# Patient Record
Sex: Female | Born: 1955 | Race: White | Hispanic: No | Marital: Married | Smoking: Never smoker
Health system: Southern US, Community
[De-identification: ages and names within clinical notes are randomized; demographics above are authoritative.]

## PROBLEM LIST (undated history)

## (undated) DIAGNOSIS — R251 Tremor, unspecified: Secondary | ICD-10-CM

## (undated) DIAGNOSIS — F329 Major depressive disorder, single episode, unspecified: Secondary | ICD-10-CM

## (undated) DIAGNOSIS — N189 Chronic kidney disease, unspecified: Secondary | ICD-10-CM

## (undated) DIAGNOSIS — F32A Depression, unspecified: Secondary | ICD-10-CM

## (undated) HISTORY — DX: Major depressive disorder, single episode, unspecified: F32.9

## (undated) HISTORY — PX: BARTHOLIN CYST MARSUPIALIZATION: SHX5383

## (undated) HISTORY — DX: Depression, unspecified: F32.A

## (undated) HISTORY — PX: ABDOMINAL HYSTERECTOMY: SHX81

---

## 1999-10-15 ENCOUNTER — Encounter (INDEPENDENT_AMBULATORY_CARE_PROVIDER_SITE_OTHER): Payer: Self-pay

## 1999-10-15 ENCOUNTER — Other Ambulatory Visit: Admission: RE | Admit: 1999-10-15 | Discharge: 1999-10-15 | Payer: Self-pay | Admitting: *Deleted

## 2000-04-20 ENCOUNTER — Other Ambulatory Visit: Admission: RE | Admit: 2000-04-20 | Discharge: 2000-04-20 | Payer: Self-pay | Admitting: *Deleted

## 2000-05-01 ENCOUNTER — Encounter (INDEPENDENT_AMBULATORY_CARE_PROVIDER_SITE_OTHER): Payer: Self-pay

## 2000-05-01 ENCOUNTER — Ambulatory Visit (HOSPITAL_COMMUNITY): Admission: RE | Admit: 2000-05-01 | Discharge: 2000-05-01 | Payer: Self-pay | Admitting: *Deleted

## 2001-04-10 ENCOUNTER — Other Ambulatory Visit: Admission: RE | Admit: 2001-04-10 | Discharge: 2001-04-10 | Payer: Self-pay | Admitting: *Deleted

## 2002-10-04 ENCOUNTER — Other Ambulatory Visit: Admission: RE | Admit: 2002-10-04 | Discharge: 2002-10-04 | Payer: Self-pay | Admitting: *Deleted

## 2003-09-10 ENCOUNTER — Encounter: Admission: RE | Admit: 2003-09-10 | Discharge: 2003-09-10 | Payer: Self-pay | Admitting: General Surgery

## 2003-10-21 ENCOUNTER — Ambulatory Visit (HOSPITAL_COMMUNITY): Admission: RE | Admit: 2003-10-21 | Discharge: 2003-10-21 | Payer: Self-pay | Admitting: Orthopaedic Surgery

## 2004-03-10 ENCOUNTER — Encounter: Admission: RE | Admit: 2004-03-10 | Discharge: 2004-03-10 | Payer: Self-pay | Admitting: *Deleted

## 2004-06-18 ENCOUNTER — Encounter (INDEPENDENT_AMBULATORY_CARE_PROVIDER_SITE_OTHER): Payer: Self-pay | Admitting: Specialist

## 2004-06-18 ENCOUNTER — Ambulatory Visit (HOSPITAL_COMMUNITY): Admission: RE | Admit: 2004-06-18 | Discharge: 2004-06-18 | Payer: Self-pay | Admitting: *Deleted

## 2004-06-18 ENCOUNTER — Ambulatory Visit (HOSPITAL_BASED_OUTPATIENT_CLINIC_OR_DEPARTMENT_OTHER): Admission: RE | Admit: 2004-06-18 | Discharge: 2004-06-18 | Payer: Self-pay | Admitting: *Deleted

## 2004-09-30 ENCOUNTER — Encounter: Admission: RE | Admit: 2004-09-30 | Discharge: 2004-09-30 | Payer: Self-pay | Admitting: *Deleted

## 2005-12-13 ENCOUNTER — Encounter: Admission: RE | Admit: 2005-12-13 | Discharge: 2005-12-13 | Payer: Self-pay | Admitting: *Deleted

## 2006-12-19 ENCOUNTER — Encounter: Admission: RE | Admit: 2006-12-19 | Discharge: 2006-12-19 | Payer: Self-pay | Admitting: Obstetrics and Gynecology

## 2007-12-24 ENCOUNTER — Encounter: Admission: RE | Admit: 2007-12-24 | Discharge: 2007-12-24 | Payer: Self-pay | Admitting: Obstetrics and Gynecology

## 2008-12-29 ENCOUNTER — Encounter: Admission: RE | Admit: 2008-12-29 | Discharge: 2008-12-29 | Payer: Self-pay | Admitting: Obstetrics and Gynecology

## 2009-01-07 ENCOUNTER — Encounter: Admission: RE | Admit: 2009-01-07 | Discharge: 2009-01-07 | Payer: Self-pay | Admitting: Obstetrics and Gynecology

## 2010-02-04 ENCOUNTER — Encounter
Admission: RE | Admit: 2010-02-04 | Discharge: 2010-02-04 | Payer: Self-pay | Source: Home / Self Care | Attending: Obstetrics & Gynecology | Admitting: Obstetrics & Gynecology

## 2010-05-28 NOTE — Op Note (Signed)
[facility]  Patient:    Raven Golden, Raven Golden                          MRN: [id number] Proc. Date: 05/01/00 Attending:  Pershing Cox, M.D.                           Operative Report  PREOPERATIVE DIAGNOSIS:  Enlarging Bartholins gland and dyspareunia.  POSTOPERATIVE DIAGNOSIS:  Enlarging Bartholins gland and dyspareunia.  PROCEDURE:  Excision of right Bartholins gland.  ANESTHESIA:  General by LMA.  SURGEON:  Pershing Cox, M.D.  INDICATIONS FOR PROCEDURE:  This patient is a 55 year old widow.  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  It has never been infected.  It has never required drainage.  In November of last year, she presented with right vulvar swelling.  She was seen by Dr. Charmayne Sheer who recommended sitz baths, and then saw her in follow up, at which time, the gland was stable.  She was seen for annual examination in March of this year.  The mass was becoming bothersome.  It was bigger in size than it had been before, increasing 1 cm to 1.5 cm since January.  The patient requested excision, and is brought to the operating room today for this procedure.  OPERATIVE FINDINGS:  A 3 cm cystic mass lying in the area of the Bartholins gland.  No other abnormal findings.  DESCRIPTION OF PROCEDURE:  The patient was brought to the operating room with an IV in place.  This had been placed in the holding area where she received 1 g of Ancef.  PAS stockings were placed on her lower extremities.  She was taken to the operating room and placed supine on the OR table.  General anesthesia was administered by LMA without difficulty.  She was then placed in the [place] stirrups, and the lower abdomen, perineum, and vagina were prepped with solution of Hibiclens.  A sterile catheter was used to empty the bladder. The noted Bartholins gland was elevated through the perineum so that the site of closest  approximation to the inner vagina could be seen.  Quarter percent Marcaine with epinephrine was injected into the skin surface of the vagina for a length of approximately 2 cm.  A scalpel was used to incise a 2 cm incision. Small plastic size Allis clamps were placed along the margins of the incision and the incisions were lifted.  Using sharp dissection, the gland was separated from the underlying vaginal mucosa, especially on the patients right side.  Careful dissection of the mass was continued down the side of the cyst until we reached the base.  Once the base had been reached, a finger was placed into the vagina to be certain that this was not attached to the rectum. It was not.  The Bartholins gland was dissected free from the underlying tissues, and removed and sent for permanent section.  With the finger in the rectum, the tissues over the rectum were closed in two layers using 3-0 Vicryl sutures.  Several individual bleeders were contained by cautery or by small suture ligation using the 3-0 Vicryl.  One area of attachment of the Bartholins gland continued to bleed.  This was clamped with a right angle and free tie ligated.  The dead space where the Bartholins gland had been was closed with  interrupted sutures of 0 Vicryl. Cautery was used when appropriate to gain hemostasis from small bleeders.  The vaginal mucosa was reapproximated using a running with alternate locking stitch of 3-0 chromic.  Packing was placed into the vagina.  She was taken to the recovery room where she was observed.  The vaginal packing will be removed before she leaves the recovery room. DD:  05/01/00 TD:  05/02/00 Job: [id number]

## 2010-05-28 NOTE — Op Note (Signed)
Raven Golden                 ACCOUNT NO.:  [id number]   MEDICAL RECORD NO.:  [id number]          PATIENT TYPE:  AMB   LOCATION:  [facility]                         FACILITY:  [facility]   PHYSICIAN:  Pershing Cox, M.D.DATE OF BIRTH:  1955/11/15   DATE OF PROCEDURE:  06/18/2004  DATE OF DISCHARGE:                                 OPERATIVE REPORT   PREOPERATIVE DIAGNOSIS:  Heavy gelatinous endometrial discharge and  endometrial filling defect.   POSTOPERATIVE DIAGNOSIS:  Lush polypoid endometrium and 10 cm endometrial  cavity.   PROCEDURE:  Dilatation and curettage with hysteroscopy and resection of  endometrium.   SURGEON:  Dr. Carey Bullocks.   ANESTHESIA:  MAC plus Marcaine paracervical block.   INDICATIONS FOR PROCEDURE:  Raven Golden is 55 years old.  On several  occasions in my office she was found to have a very thick, gelatinous  discharge which appeared to be coming from the endometrial cavity.  Sonogram  was performed to look to see if there were higher endocervical polyps, as  she had had a lower cervical polyp.  On two occasions, her endometrium was  very thick, and hydrosonogram was subsequently performed which showed the  suggestion of an endometrial polyp.  For that reason, she is brought to the  operating room today for [facility] hysteroscopy.   OPERATIVE FINDINGS:  The patient's uterus is anteflexed.  There were no  palpable adnexal masses.  Cervix is normal in appearance and the endometrial  cavity sounded to 10 cm.  The lower uterine segment was difficult to dilate,  as there appeared to be a filling defect there.  The endometrium was very  lush and very polypoid, despite the patient's Provera withdrawal in  preparation for this procedure.   PROCEDURE:  Raven Golden is brought to the operating room with an IV in  place.  She had received 1 g of Ancef in the holding area.  Supine on the OR  table, IV sedation was administered, and she was placed into Raven Golden stirrups  and  comfortably positioned for the operative procedure.  Betadine was used  to prep the lower abdomen, pelvis, and vagina.  The urethra was also prepped  with Betadine, and the bladder was sterilely emptied with a red rubber  catheter.  The patient was then draped for a sterile vaginal procedure with  a collecting drape beneath her hips to measure the effluent during her  hysteroscopic procedure.   Bivalve speculum was inserted into the vagina.  The cervix was anesthetized  on the anterior surface with Marcaine, and a single-tooth tenaculum was used  to grasp the structure.  Then a Marcaine paracervical block was administered  by injecting 0.25% Marcaine at the 3, 4, 7, and 8 positions using a total  volume of 20 mL of this solution.  Kevorkian curette was used to obtain  endocervical curettings onto a Telfa, and these were lush.  The sound then  passed to a depth of 10 cm.  Serial Pratt dilators were used to dilate the  cervix.  There was some difficulty in passing the 31 dilator.  The  hysteroscope was introduced, and there were fronds of tissue in the upper  endocervix, and these were separately collected by resection and labeled  high endocervical tissue.  Next, the dilatation was continued, and I was  able to pass up to a 33 Pratt dilator.  The resectoscope was introduced into  the cavity, and the cavity was visualized.  There was lush endometrium  covering all of the surfaces, and the ostia could not be visualized.  The  hysteroscope was removed.  A small sharp #2 curette was used to curette the  endometrial wall onto a Telfa, and the majority of the endometrium was  removed.  The hysteroscope was then reintroduced, and we could see what  appeared to be a normal endometrial cavity with no evidence of filling  defect.  The resectoscope was used to resect areas of residual endometrium  and an area on the posterior lower uterine segment which was bulbous.  After  this procedure, the cavity  was visualized, and there was no evidence of  active bleeding.  A Meigs curette was used to curette the endometrial walls.  The sound was then again passed to a depth of 8 cm, and there was no  evidence of perforation.  The tenaculum was removed.  When bleeding  persisted from these sites, silver nitrate was used to halt this bleeding.   The patient was taken to the recovery room in good condition.       MAJ/MEDQ  D:  06/18/2004  T:  06/18/2004  Job:  [id number]

## 2010-11-25 ENCOUNTER — Encounter (INDEPENDENT_AMBULATORY_CARE_PROVIDER_SITE_OTHER): Payer: Self-pay | Admitting: *Deleted

## 2010-11-25 ENCOUNTER — Telehealth (INDEPENDENT_AMBULATORY_CARE_PROVIDER_SITE_OTHER): Payer: Self-pay | Admitting: Internal Medicine

## 2010-11-25 NOTE — Telephone Encounter (Signed)
Would like to know when her next tcs is due. Please return the call to [phone].

## 2010-11-25 NOTE — Telephone Encounter (Addendum)
rec'd records from [facility], put on NUR cart to see when next TCS due, patient is aware

## 2010-11-30 NOTE — Telephone Encounter (Signed)
Per Dr Karilyn Cota, next TCS dues 8/13 but if problems develop call office, patient is aware, TCS noted for 8/13 in computer

## 2011-02-02 ENCOUNTER — Other Ambulatory Visit: Payer: Self-pay | Admitting: Obstetrics & Gynecology

## 2011-02-02 DIAGNOSIS — Z1231 Encounter for screening mammogram for malignant neoplasm of breast: Secondary | ICD-10-CM

## 2011-02-08 ENCOUNTER — Ambulatory Visit
Admission: RE | Admit: 2011-02-08 | Discharge: 2011-02-08 | Disposition: A | Discharge status: Home / Self Care | Payer: BC Managed Care – PPO | Source: Ambulatory Visit | Attending: Obstetrics & Gynecology | Admitting: Obstetrics & Gynecology

## 2011-02-08 DIAGNOSIS — Z1231 Encounter for screening mammogram for malignant neoplasm of breast: Secondary | ICD-10-CM

## 2011-05-13 ENCOUNTER — Encounter (INDEPENDENT_AMBULATORY_CARE_PROVIDER_SITE_OTHER): Payer: Self-pay

## 2011-08-18 ENCOUNTER — Encounter (INDEPENDENT_AMBULATORY_CARE_PROVIDER_SITE_OTHER): Payer: Self-pay | Admitting: *Deleted

## 2011-10-10 ENCOUNTER — Other Ambulatory Visit (INDEPENDENT_AMBULATORY_CARE_PROVIDER_SITE_OTHER): Payer: Self-pay | Admitting: *Deleted

## 2011-10-10 ENCOUNTER — Telehealth (INDEPENDENT_AMBULATORY_CARE_PROVIDER_SITE_OTHER): Payer: Self-pay | Admitting: *Deleted

## 2011-10-10 DIAGNOSIS — Z86010 Personal history of colonic polyps: Secondary | ICD-10-CM

## 2011-10-10 DIAGNOSIS — Z1211 Encounter for screening for malignant neoplasm of colon: Secondary | ICD-10-CM

## 2011-10-10 MED ORDER — PEG-KCL-NACL-NASULF-NA ASC-C 100 G PO SOLR: 1.0000 | Freq: Once | ORAL | Status: DC

## 2011-10-10 NOTE — Telephone Encounter (Signed)
Patient needs movi prep 

## 2011-11-01 ENCOUNTER — Telehealth (INDEPENDENT_AMBULATORY_CARE_PROVIDER_SITE_OTHER): Payer: Self-pay | Admitting: *Deleted

## 2011-11-01 NOTE — Telephone Encounter (Signed)
PCP/Requesting MD:   Name & DOB: Makylah Aschenbrenner [birth year]   Procedure: tcs  Reason/Indication:  Hx polyps  Has patient had this procedure before?  yes  If so, when, by whom and where?  2008  Is there a family history of colon cancer?  no  Who?  What age when diagnosed?    Is patient diabetic?   no      Does patient have prosthetic heart valve?  no  Do you have a pacemaker?  no  Has patient had joint replacement within last 12 months?  no  Is patient on Coumadin, Plavix and/or Aspirin? yes  Medications: asa 81 mg daily, lexapro 10 mg daily, primidone 50 mg 2 tab at bedtime, propranolol 60 mg daily, vit b12, vit 2  Allergies: nkda  Medication Adjustment: asa 2 days  Procedure date & time: 12/01/11 ay 1030

## 2011-11-03 NOTE — Telephone Encounter (Signed)
agree

## 2011-11-23 ENCOUNTER — Encounter (HOSPITAL_COMMUNITY): Payer: Self-pay | Admitting: Pharmacy Technician

## 2011-12-01 ENCOUNTER — Encounter (HOSPITAL_COMMUNITY)
Admission: RE | Disposition: A | Discharge status: Home / Self Care | Payer: Self-pay | Source: Ambulatory Visit | Attending: Internal Medicine

## 2011-12-01 ENCOUNTER — Encounter (HOSPITAL_COMMUNITY): Payer: Self-pay | Admitting: *Deleted

## 2011-12-01 ENCOUNTER — Ambulatory Visit (HOSPITAL_COMMUNITY)
Admission: RE | Admit: 2011-12-01 | Discharge: 2011-12-01 | Disposition: A | Discharge status: Home / Self Care | Payer: 59 | Source: Ambulatory Visit | Attending: Internal Medicine | Admitting: Internal Medicine

## 2011-12-01 DIAGNOSIS — K644 Residual hemorrhoidal skin tags: Secondary | ICD-10-CM

## 2011-12-01 DIAGNOSIS — Z860100 Personal history of colon polyps, unspecified: Secondary | ICD-10-CM | POA: Insufficient documentation

## 2011-12-01 DIAGNOSIS — K5730 Diverticulosis of large intestine without perforation or abscess without bleeding: Secondary | ICD-10-CM | POA: Insufficient documentation

## 2011-12-01 HISTORY — PX: COLONOSCOPY: SHX5424

## 2011-12-01 HISTORY — DX: Tremor, unspecified: R25.1

## 2011-12-01 SURGERY — COLONOSCOPY
Anesthesia: Moderate Sedation

## 2011-12-01 MED ORDER — MEPERIDINE HCL 50 MG/ML IJ SOLN
INTRAMUSCULAR | Status: DC | PRN
  Administered 2011-12-01: 25 mg via INTRAVENOUS

## 2011-12-01 MED ORDER — MEPERIDINE HCL 50 MG/ML IJ SOLN
INTRAMUSCULAR | Status: AC
  Filled 2011-12-01: qty 1

## 2011-12-01 MED ORDER — MIDAZOLAM HCL 5 MG/5ML IJ SOLN
INTRAMUSCULAR | Status: DC | PRN
  Administered 2011-12-01 (×2): 2 mg via INTRAVENOUS

## 2011-12-01 MED ORDER — MIDAZOLAM HCL 5 MG/5ML IJ SOLN
INTRAMUSCULAR | Status: AC
  Filled 2011-12-01: qty 10

## 2011-12-01 MED ORDER — HYOSCYAMINE SULFATE 0.125 MG SL SUBL: 0.1250 mg | SUBLINGUAL_TABLET | Freq: Three times a day (TID) | SUBLINGUAL | Status: DC | PRN

## 2011-12-01 MED ORDER — SODIUM CHLORIDE 0.45 % IV SOLN
INTRAVENOUS | Status: DC
  Administered 2011-12-01: 10:00:00 via INTRAVENOUS

## 2011-12-01 MED ORDER — STERILE WATER FOR IRRIGATION IR SOLN
Status: DC | PRN
  Administered 2011-12-01: 11:00:00

## 2011-12-01 NOTE — H&P (Signed)
Raven Golden is an 56 y.o. female.   Chief Complaint: Patient is here for colonoscopy. HPI: Patient is 56 year old Caucasian female who is here for surveillance colonoscopy. He has history of colonic adenomas. She had adenomas found in 2 prior colonoscopies. Last exam was in 2000 and she denies abdominal pain rectal bleeding. Family history is negative for colorectal carcinoma.  Past Medical History  Diagnosis Date  . Tremors of nervous system     Past Surgical History  Procedure Date  . Abdominal hysterectomy   . Cesarean section 1983, 1985    X 2    Family History  Problem Relation Age of Onset  . Colon cancer Neg Hx    Social History:  reports that she has never smoked. She does not have any smokeless tobacco history on file. She reports that she does not drink alcohol or use illicit drugs.  Allergies: No Known Allergies  Medications Prior to Admission  Medication Sig Dispense Refill  . estradiol (VIVELLE-DOT) 0.1 MG/24HR Place 1 patch onto the skin 2 (two) times a week.      . peg 3350 powder (MOVIPREP) 100 G SOLR Take 1 kit (100 g total) by mouth once.  1 kit  0  . Cholecalciferol (VITAMIN D PO) Take 1 capsule by mouth daily.      . Cyanocobalamin (VITAMIN B 12 PO) Take 1 tablet by mouth daily.      Marland Kitchen escitalopram (LEXAPRO) 10 MG tablet Take 10 mg by mouth daily.      . propranolol ER (INDERAL LA) 60 MG 24 hr capsule Take 60 mg by mouth daily.      Marland Kitchen trimethoprim (TRIMPEX) 100 MG tablet Take 100 mg by mouth daily.        No results found for this or any previous visit (from the past 48 hour(s)). No results found.  ROS  Blood pressure 104/43, pulse 48, temperature 97.7 F (36.5 C), temperature source Oral, resp. rate 10, height 5\' 3"  (1.6 m), weight 125 lb (56.7 kg), SpO2 99.00%. Physical Exam  Constitutional: She appears well-developed and well-nourished.  HENT:  Mouth/Throat: Oropharynx is clear and moist.  Eyes: Conjunctivae normal are normal. No scleral icterus.   Neck: No thyromegaly present.  Cardiovascular: Normal rate, regular rhythm and normal heart sounds.   No murmur heard. Respiratory: Effort normal and breath sounds normal.  GI: Soft. She exhibits no distension and no mass. There is no tenderness.  Musculoskeletal: She exhibits no edema.  Lymphadenopathy:    She has no cervical adenopathy.  Neurological: She is alert.  Skin: Skin is warm and dry.     Assessment/Plan History of colonic adenomas. Surveillance colonoscopy.  Raven Golden U 12/01/2011, 10:42 AM

## 2011-12-01 NOTE — Op Note (Signed)
COLONOSCOPY PROCEDURE REPORT  PATIENT:  Raven Golden  MR#:  [id number] Birthdate:  1955-12-23, 56 y.o., female Endoscopist:  Dr. Malissa Hippo, MD Referred By:  Dr. Catalina Pizza, MD Procedure Date: 12/01/2011  Procedure:   Colonoscopy  Indications:  Patient is 56 year old Caucasian female with history of colonic adenomas. She is undergoing surveillance colonoscopy.  Informed Consent:  The procedure and risks were reviewed with the patient and informed consent was obtained.  Medications:  Demerol 25 mg IV Versed 4 mg IV  Description of procedure:  After a digital rectal exam was performed, that colonoscope was advanced from the anus through the rectum and colon to the area of the cecum, ileocecal valve and appendiceal orifice. The cecum was deeply intubated. These structures were well-seen and photographed for the record. From the level of the cecum and ileocecal valve, the scope was slowly and cautiously withdrawn. The mucosal surfaces were carefully surveyed utilizing scope tip to flexion to facilitate fold flattening as needed. The scope was pulled down into the rectum where a thorough exam including retroflexion was performed. Terminal ileum was also examined.  Findings:   Prep excellent. Normal terminal ileum. Few small diverticula at sigmoid colon. No evidence of recurrent polyps. Normal rectal mucosa. Mall hemorrhoids below the dentate line.  Therapeutic/Diagnostic Maneuvers Performed:  None  Complications:  None  Cecal Withdrawal Time:  10 minutes  Impression:  Normal terminal ileum. few small diverticula at sigmoid colon and external hemorrhoids. No evidence of recurrent polyps.  Recommendations:  Standard instructions given. Sketching given for Levsin SL one 3 times a day when necessary 60 doses with 1 refill. She may wait 7 years before her next exam.  REHMAN,NAJEEB U  12/01/2011 11:11 AM  CC: Dr. Dwana Melena, MD & Dr. Bonnetta Barry ref. provider found

## 2011-12-05 ENCOUNTER — Encounter (HOSPITAL_COMMUNITY): Payer: Self-pay | Admitting: Internal Medicine

## 2014-04-10 ENCOUNTER — Other Ambulatory Visit: Payer: Self-pay | Admitting: Obstetrics & Gynecology

## 2014-04-10 DIAGNOSIS — N6310 Unspecified lump in the right breast, unspecified quadrant: Secondary | ICD-10-CM

## 2014-04-10 DIAGNOSIS — N6320 Unspecified lump in the left breast, unspecified quadrant: Secondary | ICD-10-CM

## 2014-04-14 ENCOUNTER — Other Ambulatory Visit: Payer: Self-pay

## 2014-04-16 ENCOUNTER — Ambulatory Visit
Admission: RE | Admit: 2014-04-16 | Discharge: 2014-04-16 | Disposition: A | Discharge status: Home / Self Care | Payer: 59 | Source: Ambulatory Visit | Attending: Obstetrics & Gynecology | Admitting: Obstetrics & Gynecology

## 2014-04-16 DIAGNOSIS — N6310 Unspecified lump in the right breast, unspecified quadrant: Secondary | ICD-10-CM

## 2014-04-16 DIAGNOSIS — N6320 Unspecified lump in the left breast, unspecified quadrant: Secondary | ICD-10-CM

## 2014-04-18 ENCOUNTER — Other Ambulatory Visit: Payer: Self-pay

## 2016-12-27 IMAGING — MG MM DIAG BREAST TOMO BILATERAL
6 of 10 series · 6 of 30 positions shown · non-contrast
Comparison: 05/08/2013, 02/08/2011, additional prior studies dating
back to 12/24/2007

CLINICAL DATA: 59-year-old female seen for evaluation of a right
breast mass at 10 o'clock and left breast mass at 4 o'clock. Per the
patient, her right breast feels lumpy in general. She reports focal
tenderness in the right breast at 9 o'clock. Within the left breast,
she reports an area of focal pain at 4 o'clock and a small lump at 5
o'clock.

EXAM:
DIGITAL DIAGNOSTIC BILATERAL MAMMOGRAM WITH 3D TOMOSYNTHESIS WITH
CAD
ULTRASOUND BILATERAL BREAST

[R MLO]
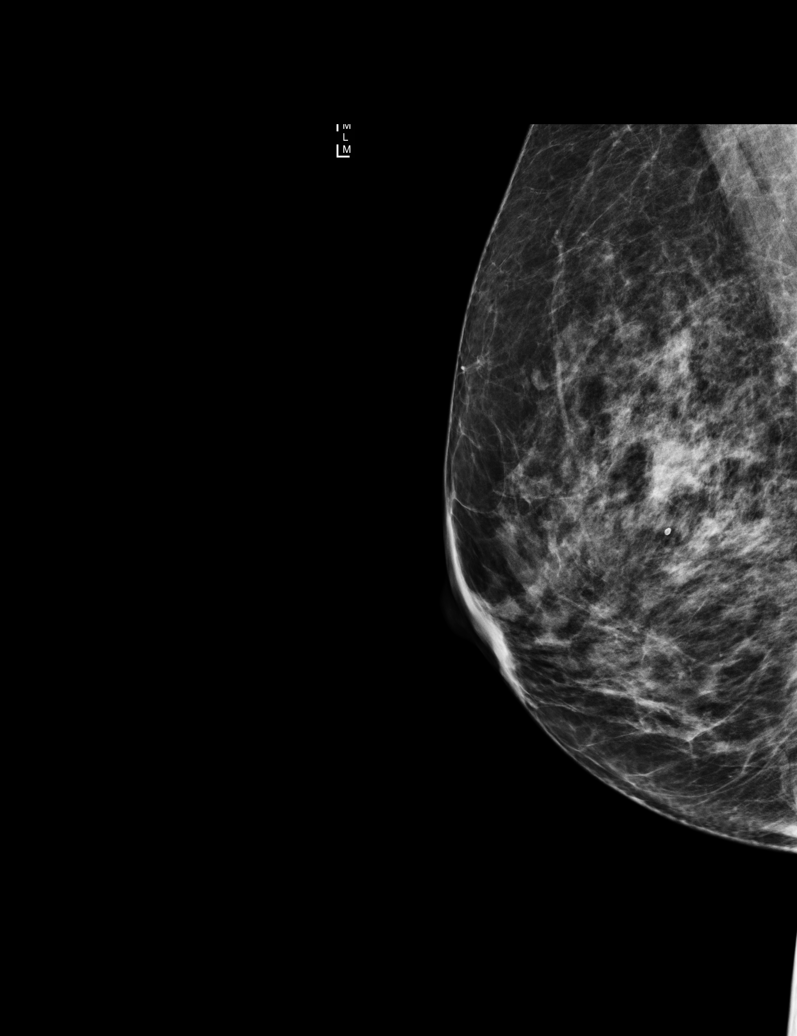

[R CC]
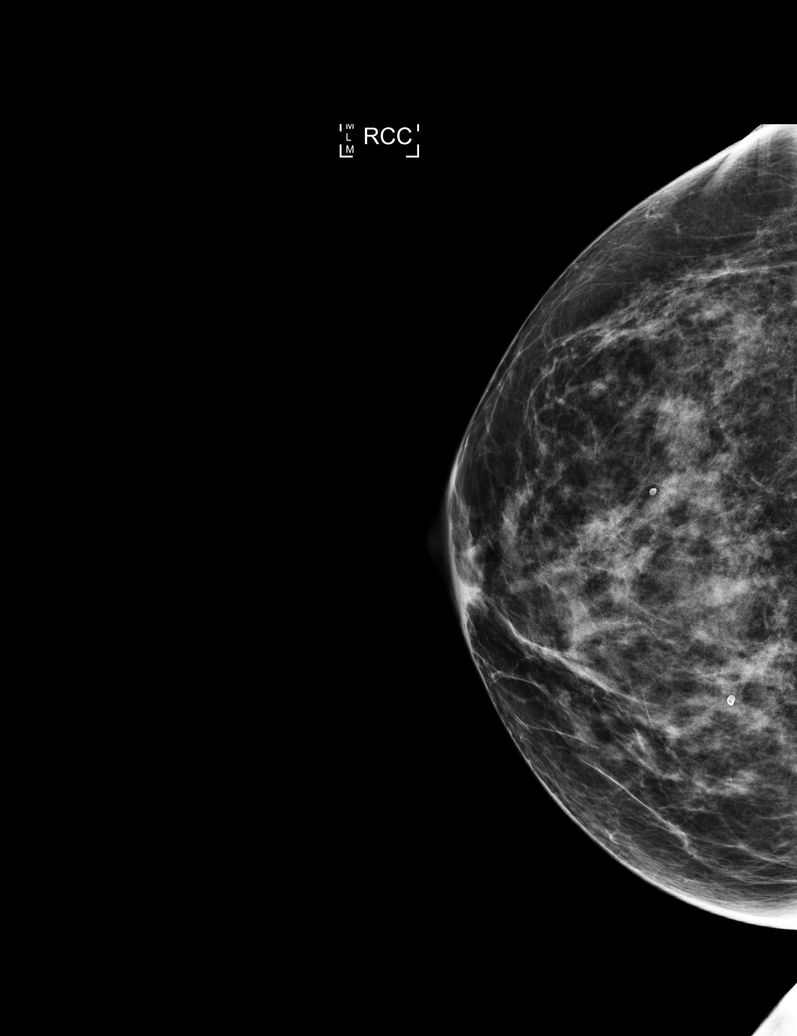

[L CC (1 of 2)]
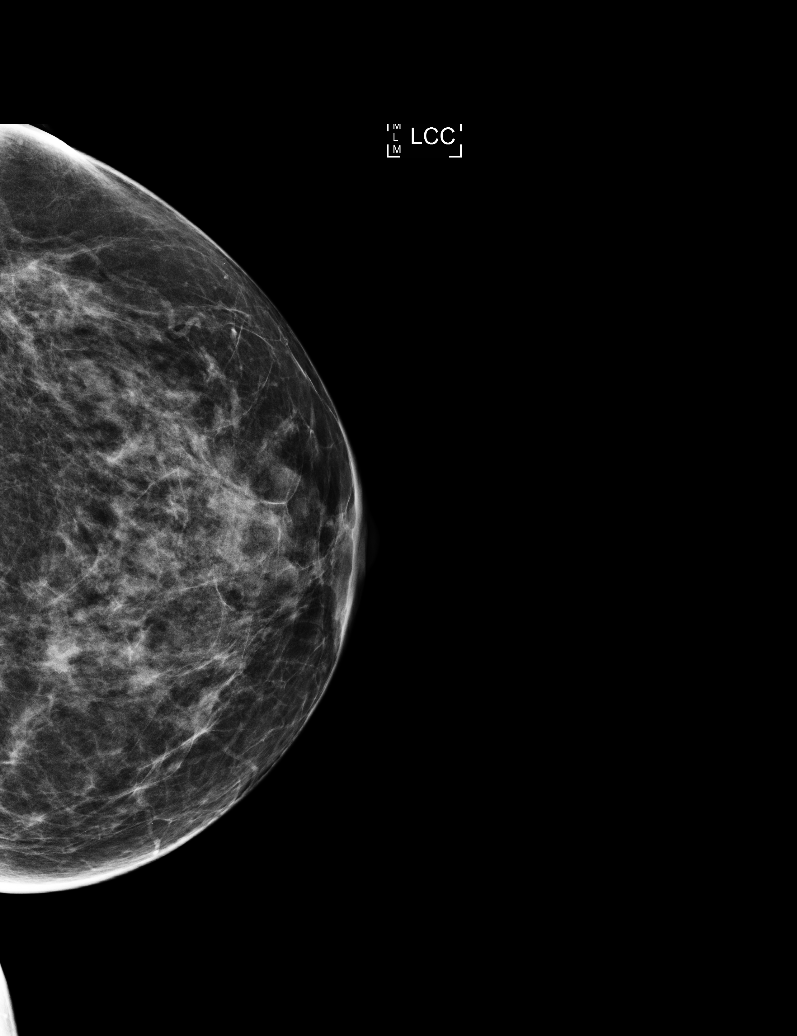

[L CC (2 of 2)]
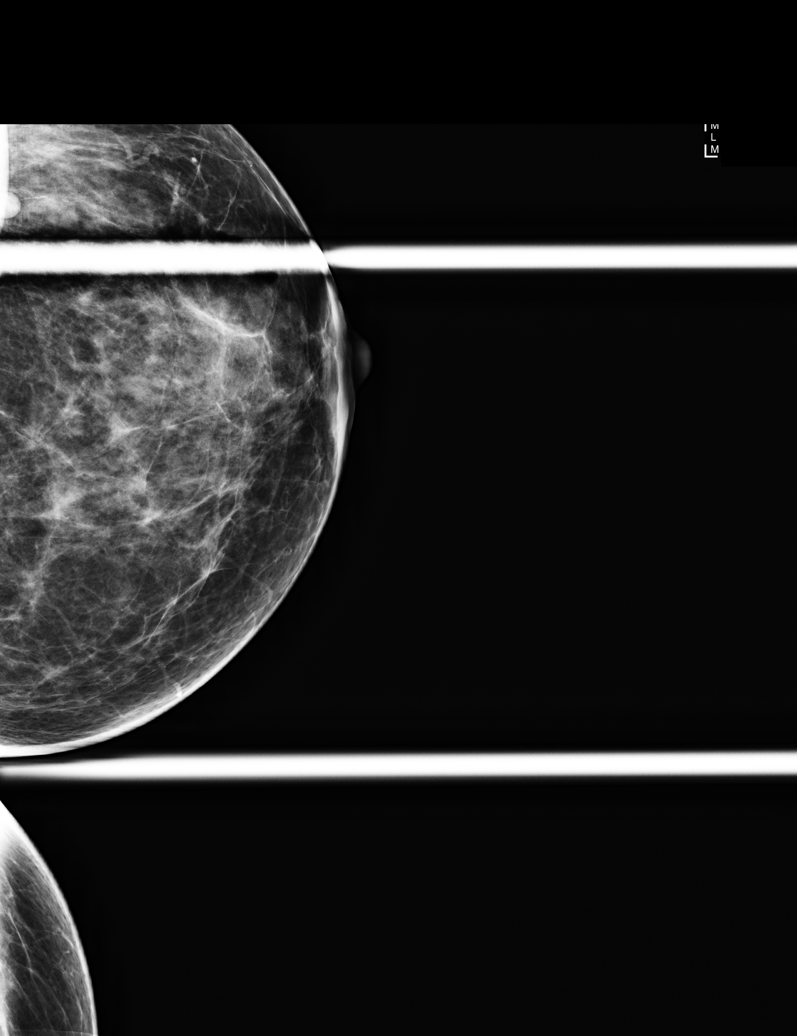

[L MLO]
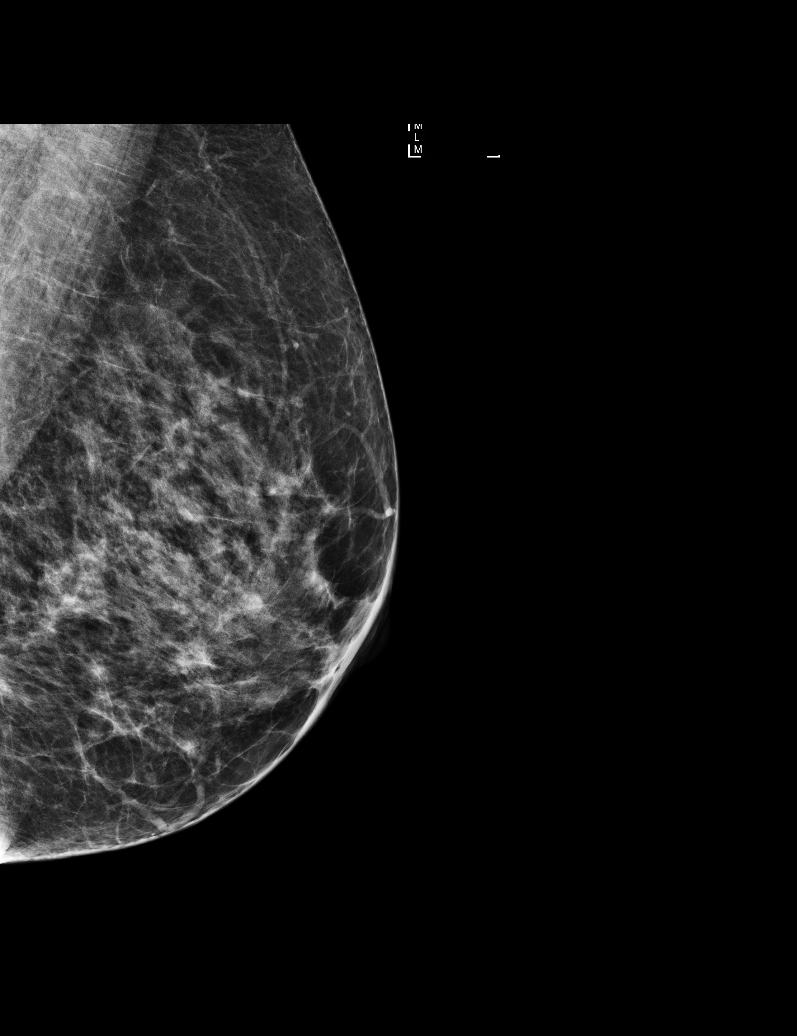

[L CC tomo · tomo slice 37/73.0]
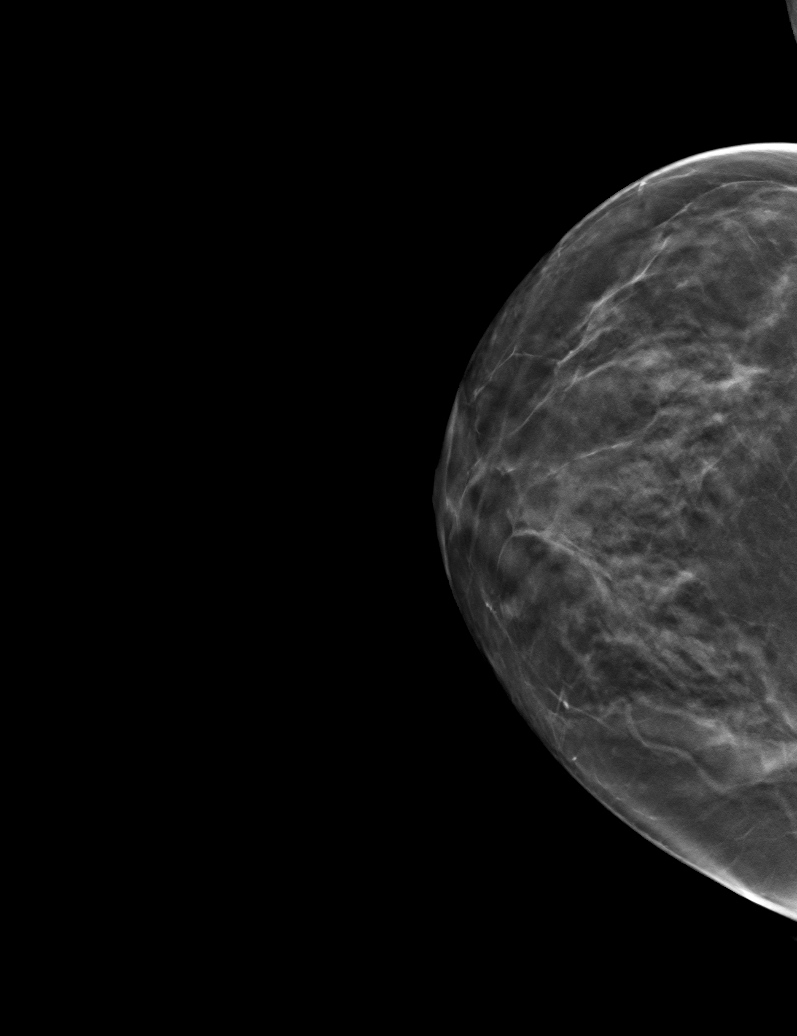

[6 of 30 positions shown; findings below may reference images not displayed]

ACR Breast Density Category c: The breast tissue is heterogeneously
dense, which may obscure small masses.
FINDINGS: Within the slightly inner right breast, posterior depth, there is an
oval 10 mm mass with predominantly circumscribed margins. An
additional oval, circumscribed mass is seen within the upper, outer
right breast which appears unchanged over multiple prior exams.

No suspicious mass, calcifications, or other abnormality is
identified within the left breast.

Mammographic images were processed with CAD.

On physical exam, no discrete mass is felt within the areas of
concern within the right breast at 9 and 10 o'clock or within the
areas of concern within the left breast at 4 and 5 o'clock.

Targeted ultrasound is performed, showing a simple cyst within the
right breast at 10 o'clock, 2 cm from the nipple measuring 8 x 6 x
10 mm, corresponding to the mass seen mammographically within the
upper, outer right breast. There is an additional minimally
complicated cyst at [DATE], 4 cm from the nipple measuring 8 x 9 x 5
mm, corresponding to the mass seen within the slightly inner right
breast. A small additional adjacent cyst is incidentally noted. No
suspicious cystic or solid sonographic finding is identified in the
areas of concern.

No suspicious cystic or solid sonographic finding is seen within the
area of tenderness within the left breast at 4 o'clock or within the
palpable area of concern within the left breast at 5 o'clock.
IMPRESSION: No mammographic or sonographic evidence of malignancy.

RECOMMENDATION:
Screening mammogram in one year.(Code:L7-Q-8DP)

I have discussed the findings and recommendations with the patient.
Results were also provided in writing at the conclusion of the
visit. If applicable, a reminder letter will be sent to the patient
regarding the next appointment.

BI-RADS CATEGORY  2: Benign.

## 2017-10-27 ENCOUNTER — Encounter: Payer: Self-pay | Admitting: Neurology

## 2017-11-06 ENCOUNTER — Encounter: Payer: Self-pay | Admitting: Neurology

## 2017-11-07 NOTE — Progress Notes (Signed)
Subjective:   LITICIA GASIOR was seen in consultation in the movement disorder clinic at the request of Vyas, Dhruv B, MD.  The evaluation is for tremor.  Tremor started in the 1990's and started in the bilateral UE's.  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  She used to take it prn but now takes it daily.  Tremor has gotten worse with time.  She started primidone 8 years ago at 100 mg daily and it helps.   Tremor is now most bothersome in the head and she will shake in the "no" direction. Stress makes this worse.  If she holds the right posterior occiput, head tremor seems to be better.  There is a family hx of tremor in her mother and maternal aunt  Affected by caffeine:  No. (doesn't drink much caffeine) Affected by alcohol:  Doesn't drink EtOH Affected by stress:  Yes.   Affected by fatigue:  No. Spills soup if on spoon:  No. Spills glass of liquid if full:  No. Affects ADL's (tying shoes, brushing teeth, etc):  No.  Current/Previously tried tremor medications: on primidone - 100 mg q hs; on propranolol - 60 mg daily  Current medications that may exacerbate tremor:  N/a  Outside reports reviewed: historical medical records, office notes and referral letter/letters.  No Known Allergies  Outpatient Encounter Medications as of 11/09/2017  Medication Sig  . Cholecalciferol (VITAMIN D PO) Take 1 capsule by mouth daily.  Marland Kitchen escitalopram (LEXAPRO) 10 MG tablet Take 10 mg by mouth daily.  Marland Kitchen estradiol (ESTRACE) 1 MG tablet Take 1 mg by mouth daily.  . Omega-3 Fatty Acids (FISH OIL) 1000 MG CAPS Take by mouth.  . primidone (MYSOLINE) 50 MG tablet Take 100 mg by mouth at bedtime.  . propranolol ER (INDERAL LA) 60 MG 24 hr capsule Take 60 mg by mouth daily.  . Red Yeast Rice Extract (RED YEAST RICE PO) Take by mouth daily.  . [DISCONTINUED] tretinoin (RETIN-A) 0.05 % cream Apply topically at bedtime.   No facility-administered encounter medications on file as of  11/09/2017.     Past Medical History:  Diagnosis Date  . Depression   . Tremors of nervous system     Past Surgical History:  Procedure Laterality Date  . ABDOMINAL HYSTERECTOMY    . BARTHOLIN CYST MARSUPIALIZATION    . CESAREAN SECTION  1983, 1985   X 2  . COLONOSCOPY  12/01/2011   Procedure: COLONOSCOPY;  Surgeon: Malissa Hippo, MD;  Location: [facility];  Service: Endoscopy;  Laterality: N/A;  1030    Social History   Socioeconomic History  . Marital status: Married    Spouse name: Not on file  . Number of children: Not on file  . Years of education: Not on file  . Highest education level: Not on file  Occupational History  . 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 Financial resource strain: Not on file  . Food insecurity:    Worry: Not on file    Inability: Not on file  . Transportation needs:    Medical: Not on file    Non-medical: Not on file  Tobacco Use  . Smoking status: Never Smoker  . Smokeless tobacco: Never Used  Substance and Sexual Activity  . Alcohol use: No  . Drug use: No  . Sexual activity: Not on file  Lifestyle  . Physical  activity:    Days per week: Not on file    Minutes per session: Not on file  . Stress: Not on file  Relationships  . Social connections:    Talks on phone: Not on file    Gets together: Not on file    Attends religious service: Not on file    Active member of club or organization: Not on file    Attends meetings of clubs or organizations: Not on file    Relationship status: Not on file  . Intimate partner violence:    Fear of current or ex partner: Not on file    Emotionally abused: Not on file    Physically abused: Not on file    Forced sexual activity: Not on file  Other Topics Concern  . Not on file  Social History Narrative  . Not on file    Family Status  Relation Name Status  . Mother  Deceased  . Father  Deceased  . Sister  Alive  . Brother  Alive  . Son  Civil engineer, contracting  . Neg Hx  (Not Specified)    Review of Systems Review of Systems  Constitutional: Positive for malaise/fatigue.  HENT: Negative.   Eyes: Negative.   Respiratory: Negative.   Cardiovascular: Negative.   Gastrointestinal: Negative.   Genitourinary: Negative.   Musculoskeletal: Negative.   Skin: Negative.   Neurological: Negative.   Endo/Heme/Allergies: Negative.   Psychiatric/Behavioral: Negative.      Objective:   VITALS:   Vitals:   11/09/17 0958  BP: (!) 94/56  Pulse: (!) 56  SpO2: 98%  Weight: 132 lb (59.9 kg)  Height: 5\' 3"  (1.6 m)   Gen:  Appears stated age and in NAD. HEENT:  Normocephalic, atraumatic. The mucous membranes are moist. The superficial temporal arteries are without ropiness or tenderness. Cardiovascular: brady.  regular Lungs: Clear to auscultation bilaterally. Neck: There are no carotid bruits noted bilaterally. MS:  There is slight hypertrophy of the R SCM and there is tremor when the head is turned L.    NEUROLOGICAL:  Orientation:  The patient is alert and oriented x 3.  Recent and remote memory are intact.  Attention span and concentration are normal.  Able to name objects and repeat without trouble.  Fund of knowledge is appropriate Cranial nerves: There is good facial symmetry. The pupils are equal round and reactive to light bilaterally. Fundoscopic exam reveals clear disc margins bilaterally. Extraocular muscles are intact except mild left exotropia and visual fields are full to confrontational testing. Speech is fluent and clear. Soft palate rises symmetrically and there is no tongue deviation. Hearing is intact to conversational tone. Tone: Tone is good throughout. Sensation: Sensation is intact to light touch and pinprick throughout (facial, trunk, extremities). Vibration is intact at the bilateral big toe. There is no extinction with double simultaneous stimulation. There is no sensory dermatomal level identified. Coordination:   The patient has no dysdiadichokinesia or dysmetria. Motor: Strength is 5/5 in the bilateral upper and lower extremities.  Shoulder shrug is equal bilaterally.  There is no pronator drift.  There are no fasciculations noted. DTR's: Deep tendon reflexes are 2/4 at the bilateral biceps, triceps, brachioradialis, patella and achilles.  Plantar responses are downgoing bilaterally. Gait and Station: The patient is able to ambulate without difficulty. The patient is able to heel toe walk without any difficulty. The patient is able to ambulate in a tandem fashion. The patient is able to stand in the Romberg position.  MOVEMENT EXAM: Tremor:  There is no rest tremor.  There is no postural tremor.  There is no intention tremor.  There is no tremor when she is given a weight to hold.  There is no trouble with Archimedes spirals.  She has no significant trouble pouring water from one glass to another.  Lab work is completed by her primary care physician and reviewed.  Lab work was completed on July 25, 2017.  White blood cells are 5.1, hemoglobin 12.2, hematocrit 35.5 and platelets 238.  TSH was 1.720.  Sodium was 137, potassium 4.8, chloride 100, CO2 24, BUN 13 and creatinine 0.75.  AST 14, ALT 9, glucose 79,     Assessment/Plan:   1.  Essential Tremor, by history.  -I really did not see any significant essential tremor today, but she describes a history of such.  She got much better when she was placed on propranolol and she is now on primidone.  I would caution the use of propranolol given her blood pressure and pulse today.  She was thinking about decreasing the dose of this because of fatigue, but told me that she cannot take the immediate release formulation and she is on the lowest long-acting formulation.  Therefore, she did not want to change the medication today.  As far as I can see, she has tremor free in her hands.  2.  Cervical Dystonia  -She does appear to have mild cervical dystonia in the  head, as evidenced by head tremor when her head is turned to the left, right sternocleidomastoid hypertrophy and a geste antagoniste.  I talked to the patient about the nature and pathophysiology.  The patient is having trouble with ADL's and with rotation of the head in daily life for driving.   We talked about treatments.  We talked about the value of botox.  The patient was educated on the botulinum toxin the black blox warning and given a copy of the botox patient medication guide.  The patient understands that this warning states that there have been reported cases of the Botox extending beyond the injection site and creating adverse effects, similar to those of botulism. This included loss of strength, trouble walking, hoarseness, trouble saying words clearly, loss of bladder control, trouble breathing, trouble swallowing, diplopia, blurry vision and ptosis. Most of the distant spread of Botox was happening in patients, primarily children, who received medication for spasticity or for cervical dystonia. The patient was given patient literature.  She wants to think about this and will call me back and let me know.  She thinks she is going to likely proceed.    CC:  Benita Stabile, MD

## 2017-11-09 ENCOUNTER — Encounter: Payer: Self-pay | Admitting: Neurology

## 2017-11-09 ENCOUNTER — Ambulatory Visit: Payer: BC Managed Care – PPO | Admitting: Neurology

## 2017-11-09 VITALS — BP 94/56 | HR 56 | Ht 63.0 in | Wt 132.0 lb

## 2017-11-09 DIAGNOSIS — I959 Hypotension, unspecified: Secondary | ICD-10-CM

## 2017-11-09 DIAGNOSIS — G243 Spasmodic torticollis: Secondary | ICD-10-CM | POA: Diagnosis not present

## 2017-11-09 DIAGNOSIS — R5383 Other fatigue: Secondary | ICD-10-CM

## 2017-11-09 DIAGNOSIS — G250 Essential tremor: Secondary | ICD-10-CM

## 2018-03-02 ENCOUNTER — Telehealth: Payer: Self-pay | Admitting: Neurology

## 2018-03-02 NOTE — Telephone Encounter (Signed)
Patient called regarding her decision to start botox. She said she wanted to make sure that her Insurance would cover it? Please Call. Thanks

## 2018-03-02 NOTE — Telephone Encounter (Signed)
Will submit to insurance for approval. Patient made aware.

## 2018-03-23 ENCOUNTER — Telehealth: Payer: Self-pay | Admitting: Neurology

## 2018-03-23 NOTE — Telephone Encounter (Signed)
Jade, please let the patient know

## 2018-03-23 NOTE — Telephone Encounter (Signed)
Raven Golden with [employer] called and stated the prior authorization for Botox will be denied. Patient must have tried Xeomin and Dysport first. She will forward this request to the medical director for review however she is not able to approve. The ref# for this request is #[id number] and the contact # is [phone].

## 2018-03-26 NOTE — Telephone Encounter (Signed)
Do you want me to offer her Xeomin (and try to get approval) or just tell her it isn't covered?

## 2018-03-26 NOTE — Telephone Encounter (Signed)
Patient made aware. She wants to have Xeomin authorization initiated. Will work on this authorization.

## 2018-03-26 NOTE — Telephone Encounter (Signed)
We can try xeomin first (although certainly not my first choice) if they approve 200 units.  I will not inject dysport

## 2018-03-30 ENCOUNTER — Telehealth: Payer: Self-pay | Admitting: Neurology

## 2018-03-30 NOTE — Telephone Encounter (Signed)
Patient made aware Xeomin approved, but all non urgent appts are on hold right now. We will schedule her once we are able to see patients again.

## 2018-04-06 ENCOUNTER — Encounter: Payer: Self-pay | Admitting: Neurology

## 2018-04-06 NOTE — Progress Notes (Signed)
Xeomin approval valid through 03/29/2019. Patient to use Buy and Bill.

## 2018-04-17 ENCOUNTER — Telehealth: Payer: Self-pay | Admitting: Neurology

## 2018-04-17 NOTE — Telephone Encounter (Signed)
Josh lmom  from [facility] regarding needing our office to initiate an Authorization. Reference # is [id number] opt 4. Please Call. Thanks

## 2018-04-18 ENCOUNTER — Telehealth: Payer: Self-pay | Admitting: *Deleted

## 2018-04-18 NOTE — Telephone Encounter (Signed)
How many units were approved?  We had 10,000 approved and then they denied the rest.

## 2018-04-18 NOTE — Telephone Encounter (Signed)
The Xeomin is approved from 03/29/2018 until 03/29/2019.  #[id number]

## 2018-04-18 NOTE — Telephone Encounter (Signed)
Approved from 03/29/2018 until 03/29/2019. #[id number]

## 2018-04-18 NOTE — Telephone Encounter (Signed)
Right.  She had more than 10,000 last time and that won't be enough for her.  Just let her know that its still pending as insurance has denied amount.  We sent appeal before Jade left.

## 2018-04-18 NOTE — Telephone Encounter (Signed)
10,000 approved.  Trying to get 5,000 more.

## 2018-04-18 NOTE — Telephone Encounter (Signed)
I have already attempted to contact [employer].  Wrong number.  Will try again.

## 2018-05-31 NOTE — Progress Notes (Deleted)
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  Have not seen her since October.  Since that time, she has decided to proceed with Botox injections.  Before injection, I wanted to see her back so that we can determine proper dosage and muscles to be injected.  She is still on propranolol LA, 60 mg daily as well as primidone, 100 mg daily.  Previous medications: Primidone: 50 mg, 2 tablets at night; Inderal LA, 60 mg daily  No Known Allergies  Outpatient Encounter Medications as of 06/01/2018  Medication Sig  . Cholecalciferol (VITAMIN D PO) Take 1 capsule by mouth daily.  Marland Kitchen escitalopram (LEXAPRO) 10 MG tablet Take 10 mg by mouth daily.  Marland Kitchen estradiol (ESTRACE) 1 MG tablet Take 1 mg by mouth daily.  . Omega-3 Fatty Acids (FISH OIL) 1000 MG CAPS Take by mouth.  . primidone (MYSOLINE) 50 MG tablet Take 100 mg by mouth at bedtime.  . propranolol ER (INDERAL LA) 60 MG 24 hr capsule Take 60 mg by mouth daily.  . Red Yeast Rice Extract (RED YEAST RICE PO) Take by mouth daily.   No facility-administered encounter medications on file as of 06/01/2018.     Past Medical History:  Diagnosis Date  . Depression   . Tremors of nervous system     Past Surgical History:  Procedure Laterality Date  . ABDOMINAL HYSTERECTOMY    . BARTHOLIN CYST MARSUPIALIZATION    . CESAREAN SECTION  1983, 1985   X 2  . COLONOSCOPY  12/01/2011   Procedure: COLONOSCOPY;  Surgeon: Malissa Hippo, MD;  Location: [facility];  Service: Endoscopy;  Laterality: N/A;  1030    Social History   Socioeconomic History  . Marital status: Married    Spouse name: Not on file  . Number of children: Not on file  . Years of education: Not on file  . Highest education level: Not on file  Occupational History  . 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 Financial resource strain: Not on file  . Food insecurity:    Worry: Not on file    Inability: Not  on file  . Transportation needs:    Medical: Not on file    Non-medical: Not on file  Tobacco Use  . Smoking status: Never Smoker  . Smokeless tobacco: Never Used  Substance and Sexual Activity  . Alcohol use: No  . Drug use: No  . Sexual activity: Not on file  Lifestyle  . Physical activity:    Days per week: Not on file    Minutes per session: Not on file  . Stress: Not on file  Relationships  . Social connections:    Talks on phone: Not on file    Gets together: Not on file    Attends religious service: Not on file    Active member of club or organization: Not on file    Attends meetings of clubs or organizations: Not on file    Relationship status: Not on file  . Intimate partner violence:    Fear of current or ex partner: Not on file    Emotionally abused: Not on file    Physically abused: Not on file    Forced sexual activity: Not on file  Other Topics Concern  . Not on file  Social History Narrative  . Not on file    Family Status  Relation  Name Status  . Mother  Deceased  . Father  Deceased  . Sister  Alive  . Brother  Alive  . Son Civil engineer, contractingx2 Alive  . Neg Hx  (Not Specified)    Review of Systems ROS   Objective:   VITALS:   There were no vitals filed for this visit. Gen:  Appears stated age and in NAD. HEENT:  Normocephalic, atraumatic. The mucous membranes are moist. The superficial temporal arteries are without ropiness or tenderness. Cardiovascular: brady.  regular Lungs: Clear to auscultation bilaterally. Neck: There are no carotid bruits noted bilaterally. MS:  There is slight hypertrophy of the right sternocleidomastoid and tremor when head is turned left.    NEUROLOGICAL:  Orientation:  The patient is alert and oriented x3. Cranial nerves: There is good facial symmetry.  Extraocular muscles are intact except mild left exotropia and visual fields are full to confrontational testing. Speech is fluent and clear. Soft palate rises symmetrically and  there is no tongue deviation. Hearing is intact to conversational tone. Tone: Tone is good throughout. Sensation: Sensation is intact to light touch throughout. Coordination:  The patient has no trouble with rapid alternating movements. Motor: Strength is at least antigravity x4. Gait and Station: Patient ambulates well  MOVEMENT EXAM: Tremor:  There is no rest tremor.  There is no postural tremor.  There is no intention tremor.    Lab work is completed by her primary care physician and reviewed.  Lab work was completed on July 25, 2017.  White blood cells are 5.1, hemoglobin 12.2, hematocrit 35.5 and platelets 238.  TSH was 1.720.  Sodium was 137, potassium 4.8, chloride 100, CO2 24, BUN 13 and creatinine 0.75.  AST 14, ALT 9, glucose 79,     Assessment/Plan:   1.  Essential Tremor, by history.  -I really did not see any significant essential tremor today or at last visit, but she describes a history of such.  She got much better when she was placed on propranolol and she is now on primidone.  I would caution the use of propranolol given her blood pressure and pulse today.  She was thinking about decreasing the dose of this because of fatigue, but told me that she cannot take the immediate release formulation and she is on the lowest long-acting formulation.  Therefore, she did not want to change the medication today.  As far as I can see, she has tremor free in her hands.  2.  Cervical Dystonia  -She does appear to have mild cervical dystonia in the head, as evidenced by head tremor when her head is turned to the left, right sternocleidomastoid hypertrophy and a geste antagoniste.  I talked to the patient about the nature and pathophysiology.  The patient is having trouble with ADL's and with rotation of the head in daily life for driving.   We talked about treatments.  We talked about the value of botox.  The patient was educated on the botulinum toxin the black blox warning and given a copy of  the botox patient medication guide.  The patient understands that this warning states that there have been reported cases of the Botox extending beyond the injection site and creating adverse effects, similar to those of botulism. This included loss of strength, trouble walking, hoarseness, trouble saying words clearly, loss of bladder control, trouble breathing, trouble swallowing, diplopia, blurry vision and ptosis. Most of the distant spread of Botox was happening in patients, primarily children, who received medication for  spasticity or for cervical dystonia. The patient was given patient literature.  She wants to think about this and will call me back and let me know.  She thinks she is going to likely proceed.    CC:  Benita Stabile, MD

## 2018-06-01 ENCOUNTER — Ambulatory Visit: Payer: BC Managed Care – PPO | Admitting: Neurology

## 2018-06-01 NOTE — Progress Notes (Signed)
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  Have not seen her since October.  Since that time, she has decided to proceed with Botox injections.  Before injection, I wanted to see her back so that we can determine proper dosage and muscles to be injected.  She is still on propranolol LA, 60 mg daily as well as primidone, 100 mg daily.  Previous medications: Primidone: 50 mg, 2 tablets at night; Inderal LA, 60 mg daily  No Known Allergies  Outpatient Encounter Medications as of 06/05/2018  Medication Sig  . Cholecalciferol (VITAMIN D PO) Take 1 capsule by mouth daily.  Marland Kitchen. escitalopram (LEXAPRO) 10 MG tablet Take 10 mg by mouth daily.  Marland Kitchen. estradiol (ESTRACE) 1 MG tablet Take 1 mg by mouth daily.  . Omega-3 Fatty Acids (FISH OIL) 1000 MG CAPS Take by mouth.  . primidone (MYSOLINE) 50 MG tablet Take 100 mg by mouth at bedtime.  . propranolol ER (INDERAL LA) 60 MG 24 hr capsule Take 60 mg by mouth daily.  . Red Yeast Rice Extract (RED YEAST RICE PO) Take by mouth daily.   No facility-administered encounter medications on file as of 06/05/2018.     Past Medical History:  Diagnosis Date  . Depression   . Tremors of nervous system     Past Surgical History:  Procedure Laterality Date  . ABDOMINAL HYSTERECTOMY    . BARTHOLIN CYST MARSUPIALIZATION    . CESAREAN SECTION  1983, 1985   X 2  . COLONOSCOPY  12/01/2011   Procedure: COLONOSCOPY;  Surgeon: Malissa HippoNajeeb U Rehman, MD;  Location: [facility];  Service: Endoscopy;  Laterality: N/A;  1030    Social History   Socioeconomic History  . Marital status: Married    Spouse name: Not on file  . Number of children: Not on file  . Years of education: Not on file  . Highest education level: Not on file  Occupational History  . 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 Financial resource strain: Not on file  . Food insecurity:    Worry: Not on file    Inability: Not  on file  . Transportation needs:    Medical: Not on file    Non-medical: Not on file  Tobacco Use  . Smoking status: Never Smoker  . Smokeless tobacco: Never Used  Substance and Sexual Activity  . Alcohol use: No  . Drug use: No  . Sexual activity: Not on file  Lifestyle  . Physical activity:    Days per week: Not on file    Minutes per session: Not on file  . Stress: Not on file  Relationships  . Social connections:    Talks on phone: Not on file    Gets together: Not on file    Attends religious service: Not on file    Active member of club or organization: Not on file    Attends meetings of clubs or organizations: Not on file    Relationship status: Not on file  . Intimate partner violence:    Fear of current or ex partner: Not on file    Emotionally abused: Not on file    Physically abused: Not on file    Forced sexual activity: Not on file  Other Topics Concern  . Not on file  Social History Narrative  . Not on file    Family Status  Relation  Name Status  . Mother  Deceased  . Father  Deceased  . Sister  Alive  . Brother  Alive  . Son Civil engineer, contracting  . Neg Hx  (Not Specified)    Review of Systems Review of Systems  Constitutional: Negative.   HENT: Negative.   Eyes: Negative.   Cardiovascular: Negative.   Skin: Negative.      Objective:   VITALS:   Vitals:   06/05/18 1007  BP: 90/60  Pulse: (!) 53  Temp: (!) 97.5 F (36.4 C)  SpO2: 98%  Weight: 130 lb (59 kg)   Gen:  Appears stated age and in NAD. HEENT:  Normocephalic, atraumatic. The mucous membranes are moist. The superficial temporal arteries are without ropiness or tenderness. Cardiovascular: brady.  regular Lungs: Clear to auscultation bilaterally. Neck: There are no carotid bruits noted bilaterally. MS:  There is slight hypertrophy of the right sternocleidomastoid and tremor when head is turned left.    NEUROLOGICAL:  Orientation:  The patient is alert and oriented x3. Cranial nerves:  There is good facial symmetry.  Extraocular muscles are intact except mild left exotropia and visual fields are full to confrontational testing. Speech is fluent and clear. Soft palate rises symmetrically and there is no tongue deviation. Hearing is intact to conversational tone. Tone: Tone is good throughout. Sensation: Sensation is intact to light touch throughout. Coordination:  The patient has no trouble with rapid alternating movements. Motor: Strength is at least antigravity x4. Gait and Station: Patient ambulates well  MOVEMENT EXAM: Tremor:  There is no rest tremor.  There is no postural tremor.  There is no intention tremor.  There is head tremor when the head is turned to the left.  Lab work is completed by her primary care physician and reviewed.  Lab work was completed on July 25, 2017.  White blood cells are 5.1, hemoglobin 12.2, hematocrit 35.5 and platelets 238.  TSH was 1.720.  Sodium was 137, potassium 4.8, chloride 100, CO2 24, BUN 13 and creatinine 0.75.  AST 14, ALT 9, glucose 79,     Assessment/Plan:   1.  Essential Tremor, by history.  -I really did not see any significant essential tremor today or at last visit, but she describes a history of such.  She got much better when she was placed on propranolol and she is now on primidone.  I would caution the use of propranolol given her blood pressure and pulse today, as well as last visit.  She was thinking about decreasing the dose of this because of fatigue, but told me that she cannot take the immediate release formulation and she is on the lowest long-acting formulation.  Therefore, she did not want to change the medication today.  As far as I can see, she has tremor free in her hands.  2.  Cervical Dystonia  -Patient does have cervical dystonia in the head, as evidenced by head tremor when her head is turned to the left, right sternocleidomastoid hypertrophy and a just and taking [place].  We talked about treatments.  We  discussed value of Botox.  We discussed the black box warning.  We discussed risk, benefits, and side effects of Botox.  The patient was educated on the botulinum toxin the black blox warning and given a copy of the botox patient medication guide.  The patient understands that this warning states that there have been reported cases of the Botox extending beyond the injection site and creating adverse effects, similar to  those of botulism. This included loss of strength, trouble walking, hoarseness, trouble saying words clearly, loss of bladder control, trouble breathing, trouble swallowing, diplopia, blurry vision and ptosis. Most of the distant spread of Botox was happening in patients, primarily children, who received medication for spasticity or for cervical dystonia. The patient expressed understanding and desire to proceed.  She is scheduled for July.  I would like to see her about 6 to 8 weeks following that injection to see how she did.  She was agreeable.    CC:  Ignatius Specking, MD

## 2018-06-05 ENCOUNTER — Ambulatory Visit: Payer: BC Managed Care – PPO | Admitting: Neurology

## 2018-06-05 ENCOUNTER — Encounter: Payer: Self-pay | Admitting: Neurology

## 2018-06-05 ENCOUNTER — Other Ambulatory Visit: Payer: Self-pay

## 2018-06-05 VITALS — BP 90/60 | HR 53 | Temp 97.5°F | Wt 130.0 lb

## 2018-06-05 DIAGNOSIS — G243 Spasmodic torticollis: Secondary | ICD-10-CM

## 2018-06-21 ENCOUNTER — Ambulatory Visit: Payer: BC Managed Care – PPO | Admitting: Neurology

## 2018-07-12 ENCOUNTER — Ambulatory Visit (INDEPENDENT_AMBULATORY_CARE_PROVIDER_SITE_OTHER): Payer: BC Managed Care – PPO | Admitting: Neurology

## 2018-07-12 ENCOUNTER — Other Ambulatory Visit: Payer: Self-pay

## 2018-07-12 ENCOUNTER — Ambulatory Visit: Payer: BC Managed Care – PPO | Admitting: Neurology

## 2018-07-12 DIAGNOSIS — G243 Spasmodic torticollis: Secondary | ICD-10-CM | POA: Diagnosis not present

## 2018-07-12 MED ORDER — INCOBOTULINUMTOXINA 100 UNITS IM SOLR
140.0000 [IU] | INTRAMUSCULAR | Status: AC
  Administered 2018-07-12: 11:00:00 140 [IU] via INTRAMUSCULAR
  Administered 2019-03-22: 300 [IU] via INTRAMUSCULAR

## 2018-07-12 NOTE — Procedures (Signed)
[facility]   Procedure Note Botox  Attending: Dr. Wells Guiles Thaddius Manes  Preoperative Diagnosis(es): Cervical Dystonia  Result History  Onset of effect: n/a  Duration of Benefit: n/an/  Adverse Effects: n/a   Consent obtained from: The patient Benefits discussed included, but were not limited to decreased muscle tightness, increased joint range of motion, and decreased pain.  Risk discussed included, but were not limited pain and discomfort, bleeding, bruising, excessive weakness, venous thrombosis, muscle atrophy and dysphagia.  A copy of the patient medication guide was given to the patient which explains the blackbox warning.  Patients identity and treatment sites confirmed Yes.  .  Details of Procedure: Skin was cleaned with alcohol.  A 30 gauge, 49mm  needle was introduced to the target muscle, except for posterior splenius where 27 gauge, 1.5 inch needle used.   Prior to injection, the needle plunger was aspirated to make sure the needle was not within a blood vessel.  There was no blood retrieved on aspiration.    Following is a summary of the muscles injected  And the amount of Botulinum toxin used:   Dilution 0.9% preservative free saline mixed with 100 u Botox type A to make 10 U per 0.1cc  Injections  Location Left  Right Units Number of sites        Sternocleidomastoid  40 40 1  Splenius Capitus, posterior approach 70  70 1  Splenius Capitus, lateral approach 30  30 1   Levator Scapulae      Trapezius            TOTAL UNITS:   140    Agent: Botulinum type A (Xeomin).  2 vials of Botox were used, each containing 100 units and freshly diluted with 1 mL of sterile, non-preserved saline   Total injected (Units): 140  Total wasted (Units): 60   Pt tolerated procedure well without complications.   Reinjection is anticipated in 3 months.  Comment:  Pt had small hematoma arise under the skin with lateral splenius capitus injection, that resolved with pressure.  Asked her  to hold ASA prior to injection next time.

## 2018-07-12 NOTE — Progress Notes (Signed)
[place]

## 2018-08-28 ENCOUNTER — Ambulatory Visit: Payer: BC Managed Care – PPO | Admitting: Neurology

## 2018-08-28 NOTE — Progress Notes (Signed)
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  Have not seen her since October.  Since that time, she has decided to proceed with Botox injections.  Before injection, I wanted to see her back so that we can determine proper dosage and muscles to be injected.  She is still on propranolol LA, 60 mg daily as well as primidone, 100 mg daily.  08/30/18 update: Patient seen for Botox follow-up.  She had Botox on July 12, 2018 for cervical dystonia.  Patient reports that she noticed a benefit for the first few weeks.  Previous medications: Primidone: 50 mg, 2 tablets at night; Inderal LA, 60 mg daily  No Known Allergies  Outpatient Encounter Medications as of 08/30/2018  Medication Sig  . Cholecalciferol (VITAMIN D PO) Take 1 capsule by mouth daily.  Marland Kitchen. escitalopram (LEXAPRO) 10 MG tablet Take 10 mg by mouth daily.  Marland Kitchen. estradiol (ESTRACE) 1 MG tablet Take 1 mg by mouth daily.  . Omega-3 Fatty Acids (FISH OIL) 1000 MG CAPS Take by mouth.  . primidone (MYSOLINE) 50 MG tablet Take 100 mg by mouth at bedtime.  . propranolol ER (INDERAL LA) 60 MG 24 hr capsule Take 60 mg by mouth daily.  . Red Yeast Rice Extract (RED YEAST RICE PO) Take by mouth daily.   Facility-Administered Encounter Medications as of 08/30/2018  Medication  . incobotulinumtoxinA (XEOMIN) 100 units injection 140 Units    Past Medical History:  Diagnosis Date  . Depression   . Tremors of nervous system     Past Surgical History:  Procedure Laterality Date  . ABDOMINAL HYSTERECTOMY    . BARTHOLIN CYST MARSUPIALIZATION    . CESAREAN SECTION  1983, 1985   X 2  . COLONOSCOPY  12/01/2011   Procedure: COLONOSCOPY;  Surgeon: Malissa HippoNajeeb U Rehman, MD;  Location: [facility];  Service: Endoscopy;  Laterality: N/A;  1030    Social History   Socioeconomic History  . Marital status: Married    Spouse name: Not on file  . Number of children: 2  . Years of education: 7115  . Highest education level:  Not on file  Occupational History  . 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 Financial resource strain: Not on file  . Food insecurity    Worry: Not on file    Inability: Not on file  . Transportation needs    Medical: Not on file    Non-medical: Not on file  Tobacco Use  . Smoking status: Never Smoker  . Smokeless tobacco: Never Used  Substance and Sexual Activity  . Alcohol use: No  . Drug use: No  . Sexual activity: Not on file  Lifestyle  . Physical activity    Days per week: Not on file    Minutes per session: Not on file  . Stress: Not on file  Relationships  . Social Musicianconnections    Talks on phone: Not on file    Gets together: Not on file    Attends religious service: Not on file    Active member of club or organization: Not on file    Attends meetings of clubs or organizations: Not on file    Relationship status: Not on file  . Intimate partner violence    Fear of current or ex partner: Not on file    Emotionally abused: Not on file    Physically abused: Not on  file    Forced sexual activity: Not on file  Other Topics Concern  . Not on file  Social History Narrative   2 children   Lives with husband   Two story home    Right handed    Family Status  Relation Name Status  . Mother  Deceased  . Father  Deceased  . Sister  Alive  . Brother  Alive  . Son Social research officer, government  . Neg Hx  (Not Specified)    Review of Systems Review of Systems  Constitutional: Negative.   HENT: Negative.   Eyes: Negative.   Respiratory: Negative.   Cardiovascular: Negative.   Gastrointestinal: Negative.   Skin: Negative.      Objective:   VITALS:   Vitals:   08/30/18 1354  BP: 110/62  Pulse: 95  SpO2: 98%  Weight: 129 lb (58.5 kg)  Height: 5' 3.5" (1.613 m)   Gen:  Appears stated age and in NAD. HEENT:  Normocephalic, atraumatic. The mucous membranes are moist. The superficial temporal arteries are without ropiness or tenderness.  Cardiovascular: RRR Lungs: Clear to auscultation bilaterally. Neck: There are no carotid bruits noted bilaterally. MS:  There is hypertrophy of the L SCM today (different than last visit).  Head is also turned to the right.     NEUROLOGICAL:  Orientation:  The patient is alert and oriented x3. Cranial nerves: There is good facial symmetry.  Extraocular muscles are intact except mild left exotropia and visual fields are full to confrontational testing. Speech is fluent and clear. Soft palate rises symmetrically and there is no tongue deviation. Hearing is intact to conversational tone. Tone: Tone is good throughout. Sensation: Sensation is intact to light touch throughout. Coordination:  The patient has no trouble with rapid alternating movements. Motor: Strength is at least antigravity x4. Gait and Station: Patient ambulates well  MOVEMENT EXAM: Tremor:  There is no rest tremor.  There is no postural tremor.  There is no intention tremor.  There is head tremor when the head is turned to the left.  Lab work is completed by her primary care physician and reviewed.  Lab work was completed on July 25, 2017.  White blood cells are 5.1, hemoglobin 12.2, hematocrit 35.5 and platelets 238.  TSH was 1.720.  Sodium was 137, potassium 4.8, chloride 100, CO2 24, BUN 13 and creatinine 0.75.  AST 14, ALT 9, glucose 79,     Assessment/Plan:   1.  Essential Tremor, by history.  -I really did not see any significant essential tremor today or at last visit, but she describes a history of such.  She got much better when she was placed on propranolol and she is now on primidone.  I would caution the use of propranolol given her blood pressure and pulse today, as well as last visit.  She was thinking about decreasing the dose of this because of fatigue, but told me that she cannot take the immediate release formulation and she is on the lowest long-acting formulation.  Therefore, she did not want to change the  medication today.  As far as I can see, she has tremor free in her hands.  2.  Cervical Dystonia  -Patient's next injections are on November 6.  Today, I felt that she had much more hypertrophy of the left sternocleidomastoid, and pull of the head to the right, which I did not see before.  I am going to try and change up my injection approach to accommodate this.  I likely will increase the dosage as well.      CC:  Ignatius SpeckingVyas, Dhruv B, MD

## 2018-08-30 ENCOUNTER — Other Ambulatory Visit: Payer: Self-pay

## 2018-08-30 ENCOUNTER — Encounter: Payer: Self-pay | Admitting: Neurology

## 2018-08-30 ENCOUNTER — Ambulatory Visit: Payer: BC Managed Care – PPO | Admitting: Neurology

## 2018-08-30 VITALS — BP 110/62 | HR 95 | Ht 63.5 in | Wt 129.0 lb

## 2018-08-30 DIAGNOSIS — G243 Spasmodic torticollis: Secondary | ICD-10-CM

## 2018-10-12 ENCOUNTER — Ambulatory Visit: Payer: BC Managed Care – PPO | Admitting: Neurology

## 2018-11-06 ENCOUNTER — Encounter (INDEPENDENT_AMBULATORY_CARE_PROVIDER_SITE_OTHER): Payer: Self-pay | Admitting: *Deleted

## 2018-11-16 ENCOUNTER — Ambulatory Visit (INDEPENDENT_AMBULATORY_CARE_PROVIDER_SITE_OTHER): Payer: BC Managed Care – PPO | Admitting: Neurology

## 2018-11-16 ENCOUNTER — Other Ambulatory Visit: Payer: Self-pay

## 2018-11-16 DIAGNOSIS — G243 Spasmodic torticollis: Secondary | ICD-10-CM

## 2018-11-16 MED ORDER — INCOBOTULINUMTOXINA 100 UNITS IM SOLR
100.0000 [IU] | INTRAMUSCULAR | Status: AC
  Administered 2018-11-16: 100 [IU] via INTRAMUSCULAR

## 2018-11-16 NOTE — Procedures (Signed)
[facility]   Procedure Note Botox  Attending: Dr. Wells Guiles Ashwin Tibbs  Preoperative Diagnosis(es): Cervical Dystonia  Result History  Onset of effect: unknown  Duration of Benefit: only lasted few weeks   Adverse Effects: n/a   Consent obtained from: The patient Benefits discussed included, but were not limited to decreased muscle tightness, increased joint range of motion, and decreased pain.  Risk discussed included, but were not limited pain and discomfort, bleeding, bruising, excessive weakness, venous thrombosis, muscle atrophy and dysphagia.  A copy of the patient medication guide was given to the patient which explains the blackbox warning.  Patients identity and treatment sites confirmed Yes.  .  Details of Procedure: Skin was cleaned with alcohol.  A 30 gauge, 94mm  needle was introduced to the target muscle, except for posterior splenius where 27 gauge, 1.5 inch needle used.   Prior to injection, the needle plunger was aspirated to make sure the needle was not within a blood vessel.  There was no blood retrieved on aspiration.    Following is a summary of the muscles injected  And the amount of Botulinum toxin used:   Dilution 0.9% preservative free saline mixed with 100 u Botox type A to make 10 U per 0.1cc  Injections  Location Left  Right Units Number of sites        Sternocleidomastoid 60  60 1  Splenius Capitus, posterior approach  100 100 1  Splenius Capitus, lateral approach  40 40 1  Levator Scapulae      Trapezius            TOTAL UNITS:   200    Agent: Botulinum toxin type A (Xeomin).  2 vials of Botox were used, each containing 100 units and freshly diluted with 1 mL of sterile, non-preserved saline   Total injected (Units): 200  Total wasted (Units): none wasted   Pt tolerated procedure well without complications.   Reinjection is anticipated in 3 months.

## 2018-11-23 ENCOUNTER — Other Ambulatory Visit (INDEPENDENT_AMBULATORY_CARE_PROVIDER_SITE_OTHER): Payer: Self-pay | Admitting: *Deleted

## 2018-11-23 DIAGNOSIS — Z86010 Personal history of colonic polyps: Secondary | ICD-10-CM

## 2018-12-21 NOTE — Progress Notes (Signed)
Raven Golden was seen today in follow up for cervical dystonia.  Her last Botox was on November 16, 2018.  The pattern of injections and dosage was changed.  She reports that it got better for a week but now it seems the same.  It is worse when stressed.   ALLERGIES:  No Known Allergies  CURRENT MEDICATIONS:  Outpatient Encounter Medications as of 12/24/2018  Medication Sig  . Cholecalciferol (VITAMIN D PO) Take 1 capsule by mouth daily.  Marland Kitchen escitalopram (LEXAPRO) 10 MG tablet Take 10 mg by mouth daily.  Marland Kitchen estradiol (ESTRACE) 1 MG tablet Take 1 mg by mouth daily.  . Omega-3 Fatty Acids (FISH OIL) 1000 MG CAPS Take by mouth.  . primidone (MYSOLINE) 50 MG tablet Take 100 mg by mouth at bedtime.  . propranolol ER (INDERAL LA) 60 MG 24 hr capsule Take 60 mg by mouth daily.  . Red Yeast Rice Extract (RED YEAST RICE PO) Take by mouth daily.   Facility-Administered Encounter Medications as of 12/24/2018  Medication  . incobotulinumtoxinA (XEOMIN) 100 units injection 100 Units  . incobotulinumtoxinA (XEOMIN) 100 units injection 140 Units    PAST MEDICAL HISTORY:   Past Medical History:  Diagnosis Date  . Depression   . Tremors of nervous system     PAST SURGICAL HISTORY:   Past Surgical History:  Procedure Laterality Date  . ABDOMINAL HYSTERECTOMY    . BARTHOLIN CYST MARSUPIALIZATION    . [place]   X 2  . COLONOSCOPY  12/01/2011   Procedure: COLONOSCOPY;  Surgeon: Rogene Houston, MD;  Location: [facility];  Service: Endoscopy;  Laterality: N/A;  1030    SOCIAL HISTORY:   Social History   Socioeconomic History  . Marital status: Married    Spouse name: Not on file  . Number of children: 2  . Years of education: 34  . Highest education level: Not on file  Occupational History  . 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 Smoking status: Never Smoker  . Smokeless tobacco: Never Used  Substance and Sexual  Activity  . Alcohol use: No  . Drug use: No  . Sexual activity: Not on file  Other Topics Concern  . Not on file  Social History Narrative   2 children   Lives with husband   Two story home    Right handed   Social Determinants of Health   Financial Resource Strain:   . Difficulty of Paying Living Expenses: Not on file  Food Insecurity:   . Worried About Charity fundraiser in the Last Year: Not on file  . Ran Out of Food in the Last Year: Not on file  Transportation Needs:   . Lack of Transportation (Medical): Not on file  . Lack of Transportation (Non-Medical): Not on file  Physical Activity:   . Days of Exercise per Week: Not on file  . Minutes of Exercise per Session: Not on file  Stress:   . Feeling of Stress : Not on file  Social Connections:   . Frequency of Communication with Friends and Family: Not on file  . Frequency of Social Gatherings with Friends and Family: Not on file  . Attends Religious Services: Not on file  . Active Member of Clubs or Organizations: Not on file  . Attends Archivist Meetings: Not on file  . Marital Status: Not on file  Intimate Partner Violence:   . Fear of Current or Ex-Partner: Not on file  . Emotionally Abused: Not on file  . Physically Abused: Not on file  . Sexually Abused: Not on file    FAMILY HISTORY:   Family Status  Relation Name Status  . Mother  Deceased  . Father  Deceased  . Sister  Alive  . Brother  Alive  . Son Civil engineer, contracting  . Neg Hx  (Not Specified)    ROS:  Review of Systems  Constitutional: Negative.   HENT: Negative.   Respiratory: Negative.   Cardiovascular: Negative.   Genitourinary: Negative.   Musculoskeletal: Positive for neck pain.  Skin: Negative.     PHYSICAL EXAMINATION:    VITALS:   Vitals:   12/24/18 0833  BP: (!) 103/53  Pulse: 70  Resp: 14  SpO2: 97%  Weight: 128 lb (58.1 kg)  Height: 5\' 3"  (1.6 m)    GEN:  The patient appears stated age and is in NAD. HEENT:   Normocephalic, atraumatic.  The mucous membranes are moist. The superficial temporal arteries are without ropiness or tenderness. CV:  RRR Lungs:  CTAB Neck/HEME:  There are no carotid bruits bilaterally.  Head/neck is midline today with occasional head tremor  Neurological examination:  Orientation: The patient is alert and oriented x3. Cranial nerves: There is good facial symmetry.The speech is fluent and clear. Soft palate rises symmetrically and there is no tongue deviation. Hearing is intact to conversational tone. Sensation: Sensation is intact to light touch throughout Motor: Strength is at least antigravity x4.  Movement examination: Tone: There is normal tone in the UE/LE Abnormal movements:  Occasional head tremor in the "no" direction.  No myoclonus.  No asterixis.   Coordination:  There is no decremation with RAM's. Gait and Station: The patient has no difficulty arising out of a deep-seated chair without the use of the hands. The patient's stride length is good.     ASSESSMENT/PLAN:  1.  Cervical dystonia  -Muscles involved include left sternocleidomastoid and right splenius capitis.  We will try and increase the dosage.  If that doesn't help, perhaps we can consider changing toxin type.  Cc:  , MD

## 2018-12-24 ENCOUNTER — Ambulatory Visit (INDEPENDENT_AMBULATORY_CARE_PROVIDER_SITE_OTHER): Payer: BC Managed Care – PPO | Admitting: Neurology

## 2018-12-24 ENCOUNTER — Other Ambulatory Visit: Payer: Self-pay

## 2018-12-24 ENCOUNTER — Encounter: Payer: Self-pay | Admitting: Neurology

## 2018-12-24 VITALS — BP 103/53 | HR 70 | Resp 14 | Ht 63.0 in | Wt 128.0 lb

## 2018-12-24 DIAGNOSIS — G243 Spasmodic torticollis: Secondary | ICD-10-CM | POA: Diagnosis not present

## 2019-01-15 ENCOUNTER — Telehealth (INDEPENDENT_AMBULATORY_CARE_PROVIDER_SITE_OTHER): Payer: Self-pay | Admitting: *Deleted

## 2019-01-15 ENCOUNTER — Encounter (INDEPENDENT_AMBULATORY_CARE_PROVIDER_SITE_OTHER): Payer: Self-pay | Admitting: *Deleted

## 2019-01-15 ENCOUNTER — Other Ambulatory Visit (INDEPENDENT_AMBULATORY_CARE_PROVIDER_SITE_OTHER): Payer: Self-pay | Admitting: *Deleted

## 2019-01-15 NOTE — Telephone Encounter (Signed)
Patient needs suprep TCS sch'd 2/4 

## 2019-01-16 ENCOUNTER — Telehealth (INDEPENDENT_AMBULATORY_CARE_PROVIDER_SITE_OTHER): Payer: Self-pay | Admitting: *Deleted

## 2019-01-16 ENCOUNTER — Other Ambulatory Visit: Payer: Self-pay

## 2019-01-16 ENCOUNTER — Ambulatory Visit (INDEPENDENT_AMBULATORY_CARE_PROVIDER_SITE_OTHER): Payer: Self-pay

## 2019-01-16 MED ORDER — SUPREP BOWEL PREP KIT 17.5-3.13-1.6 GM/177ML PO SOLN: 1.0000 | Freq: Once | ORAL | 0 refills | Status: AC

## 2019-01-16 NOTE — Telephone Encounter (Signed)
Referring MD/PCP: vyas   Procedure: tcs  Reason/Indication:  Hx polyps  Has patient had this procedure before?  Yes, 2013  If so, when, by whom and where?    Is there a family history of colon cancer?  no  Who?  What age when diagnosed?    Is patient diabetic?   no      Does patient have prosthetic heart valve or mechanical valve?  no  Do you have a pacemaker/defibrillator?  no  Has patient ever had endocarditis/atrial fibrillation? no  Does patient use oxygen? no  Has patient had joint replacement within last 12 months?  no  Is patient constipated or do they take laxatives? no  Does patient have a history of alcohol/drug use?  no  Is patient on blood thinner such as Coumadin, Plavix and/or Aspirin? yes  Medications: asa 81 mg daily, lexapro 10 mg daily, propranolol 60 mg daily, primidone 100 mg bid, estradiol 1 mg daily  Allergies: nkda  Medication Adjustment per Dr Karilyn Cota: asa 2 days  Procedure date & time: 02/14/19 at 730

## 2019-01-16 NOTE — Telephone Encounter (Signed)
Rx sent to pharmacy   

## 2019-01-21 NOTE — Telephone Encounter (Signed)
Colonoscopy with conscious sedation 

## 2019-02-12 ENCOUNTER — Other Ambulatory Visit (HOSPITAL_COMMUNITY): Payer: BC Managed Care – PPO

## 2019-02-14 ENCOUNTER — Encounter (HOSPITAL_COMMUNITY): Payer: Self-pay

## 2019-02-14 ENCOUNTER — Ambulatory Visit (HOSPITAL_COMMUNITY): Admit: 2019-02-14 | Payer: BC Managed Care – PPO | Admitting: Internal Medicine

## 2019-02-14 DIAGNOSIS — Z86010 Personal history of colonic polyps: Secondary | ICD-10-CM

## 2019-02-14 SURGERY — COLONOSCOPY
Anesthesia: Moderate Sedation

## 2019-03-06 ENCOUNTER — Other Ambulatory Visit (INDEPENDENT_AMBULATORY_CARE_PROVIDER_SITE_OTHER): Payer: Self-pay | Admitting: *Deleted

## 2019-03-06 ENCOUNTER — Encounter (INDEPENDENT_AMBULATORY_CARE_PROVIDER_SITE_OTHER): Payer: Self-pay | Admitting: *Deleted

## 2019-03-06 ENCOUNTER — Telehealth (INDEPENDENT_AMBULATORY_CARE_PROVIDER_SITE_OTHER): Payer: Self-pay | Admitting: *Deleted

## 2019-03-06 DIAGNOSIS — Z86010 Personal history of colonic polyps: Secondary | ICD-10-CM

## 2019-03-06 MED ORDER — SUPREP BOWEL PREP KIT 17.5-3.13-1.6 GM/177ML PO SOLN: 1.0000 | Freq: Once | ORAL | 0 refills | Status: AC

## 2019-03-06 NOTE — Telephone Encounter (Signed)
Patient needs suprep (copay card) TCS sch'd 4/21

## 2019-03-22 ENCOUNTER — Ambulatory Visit (INDEPENDENT_AMBULATORY_CARE_PROVIDER_SITE_OTHER): Payer: BC Managed Care – PPO | Admitting: Neurology

## 2019-03-22 ENCOUNTER — Other Ambulatory Visit: Payer: Self-pay

## 2019-03-22 DIAGNOSIS — G243 Spasmodic torticollis: Secondary | ICD-10-CM | POA: Diagnosis not present

## 2019-03-22 NOTE — Procedures (Signed)
[facility]   Procedure Note Botox  Attending: Dr. Lurena Joiner Natasja Niday  Preoperative Diagnosis(es): Cervical Dystonia  Result History  Onset of effect: unknown  Duration of Benefit: only lasted few weeks   Adverse Effects: n/a   Consent obtained from: The patient Benefits discussed included, but were not limited to decreased muscle tightness, increased joint range of motion, and decreased pain.  Risk discussed included, but were not limited pain and discomfort, bleeding, bruising, excessive weakness, venous thrombosis, muscle atrophy and dysphagia.  A copy of the patient medication guide was given to the patient which explains the blackbox warning.  Patients identity and treatment sites confirmed Yes.  .  Details of Procedure: Skin was cleaned with alcohol.  A 30 gauge, 104mm  needle was introduced to the target muscle, except for posterior splenius where 27 gauge, 1.5 inch needle used.   Prior to injection, the needle plunger was aspirated to make sure the needle was not within a blood vessel.  There was no blood retrieved on aspiration.    Following is a summary of the muscles injected  And the amount of Botulinum toxin used:   Dilution 0.9% preservative free saline mixed with 100 u Botox type A to make 10 U per 0.1cc  Injections  Location Left  Right Units Number of sites        Sternocleidomastoid 60/20  80 2  Splenius Capitus, posterior approach  100 100 1  Splenius Capitus, lateral approach  60 60 1  Levator Scapulae      Trapezius            TOTAL UNITS:   240    Agent: Botulinum toxin type A (Xeomin).  3 vials of Botox were used, each containing 100 units and freshly diluted with 1 mL of sterile, non-preserved saline   Total injected (Units): 200  Total wasted (Units): 60   Pt tolerated procedure well without complications.   Reinjection is anticipated in 3 months.

## 2019-04-03 ENCOUNTER — Telehealth (INDEPENDENT_AMBULATORY_CARE_PROVIDER_SITE_OTHER): Payer: Self-pay | Admitting: *Deleted

## 2019-04-03 ENCOUNTER — Ambulatory Visit (INDEPENDENT_AMBULATORY_CARE_PROVIDER_SITE_OTHER): Payer: Self-pay

## 2019-04-03 ENCOUNTER — Other Ambulatory Visit: Payer: Self-pay

## 2019-04-03 NOTE — Telephone Encounter (Signed)
Referring MD/PCP: vyas   Procedure: tcs  Reason/Indication:  Hx polyps  Has patient had this procedure before?  Yes, 2013  If so, when, by whom and where?    Is there a family history of colon cancer?  no  Who?  What age when diagnosed?    Is patient diabetic?   no      Does patient have prosthetic heart valve or mechanical valve?  no  Do you have a pacemaker/defibrillator?  no  Has patient ever had endocarditis/atrial fibrillation? no  Does patient use oxygen? no  Has patient had joint replacement within last 12 months?  no  Is patient constipated or do they take laxatives? no  Does patient have a history of alcohol/drug use?  no  Is patient on blood thinner such as Coumadin, Plavix and/or Aspirin? yes  Medications: lexapro 10 mg daily, asa 81 mg daily, propranolol 60 mg daily, primidone 100 mg bid, estradiol 1 mg daily  Allergies: nkda  Medication Adjustment per Dr Karilyn Cota:   Procedure date & time: 05/01/19

## 2019-04-15 NOTE — Telephone Encounter (Signed)
Colonoscopy with conscious sedation 

## 2019-04-29 ENCOUNTER — Other Ambulatory Visit: Payer: Self-pay

## 2019-04-29 ENCOUNTER — Other Ambulatory Visit (HOSPITAL_COMMUNITY)
Admission: RE | Admit: 2019-04-29 | Discharge: 2019-04-29 | Disposition: A | Discharge status: Home / Self Care | Payer: BC Managed Care – PPO | Source: Ambulatory Visit | Attending: Internal Medicine | Admitting: Internal Medicine

## 2019-04-29 DIAGNOSIS — Z20822 Contact with and (suspected) exposure to covid-19: Secondary | ICD-10-CM | POA: Insufficient documentation

## 2019-04-29 DIAGNOSIS — Z01812 Encounter for preprocedural laboratory examination: Secondary | ICD-10-CM | POA: Diagnosis present

## 2019-04-30 LAB — SARS CORONAVIRUS 2 (TAT 6-24 HRS): SARS Coronavirus 2: NEGATIVE

## 2019-05-01 ENCOUNTER — Other Ambulatory Visit: Payer: Self-pay

## 2019-05-01 ENCOUNTER — Encounter (HOSPITAL_COMMUNITY)
Admission: RE | Disposition: A | Discharge status: Home / Self Care | Payer: Self-pay | Source: Home / Self Care | Attending: Internal Medicine

## 2019-05-01 ENCOUNTER — Ambulatory Visit (HOSPITAL_COMMUNITY)
Admission: RE | Admit: 2019-05-01 | Discharge: 2019-05-01 | Disposition: A | Discharge status: Home / Self Care | Payer: BC Managed Care – PPO | Attending: Internal Medicine | Admitting: Internal Medicine

## 2019-05-01 ENCOUNTER — Encounter (HOSPITAL_COMMUNITY): Payer: Self-pay | Admitting: Internal Medicine

## 2019-05-01 DIAGNOSIS — Z86010 Personal history of colonic polyps: Secondary | ICD-10-CM | POA: Insufficient documentation

## 2019-05-01 DIAGNOSIS — K5730 Diverticulosis of large intestine without perforation or abscess without bleeding: Secondary | ICD-10-CM | POA: Diagnosis not present

## 2019-05-01 DIAGNOSIS — F329 Major depressive disorder, single episode, unspecified: Secondary | ICD-10-CM | POA: Diagnosis not present

## 2019-05-01 DIAGNOSIS — Z1211 Encounter for screening for malignant neoplasm of colon: Secondary | ICD-10-CM | POA: Insufficient documentation

## 2019-05-01 DIAGNOSIS — Z79899 Other long term (current) drug therapy: Secondary | ICD-10-CM | POA: Insufficient documentation

## 2019-05-01 DIAGNOSIS — N189 Chronic kidney disease, unspecified: Secondary | ICD-10-CM | POA: Insufficient documentation

## 2019-05-01 DIAGNOSIS — Z09 Encounter for follow-up examination after completed treatment for conditions other than malignant neoplasm: Secondary | ICD-10-CM | POA: Diagnosis not present

## 2019-05-01 DIAGNOSIS — K644 Residual hemorrhoidal skin tags: Secondary | ICD-10-CM | POA: Diagnosis not present

## 2019-05-01 HISTORY — PX: COLONOSCOPY: SHX5424

## 2019-05-01 HISTORY — DX: Chronic kidney disease, unspecified: N18.9

## 2019-05-01 SURGERY — COLONOSCOPY
Anesthesia: Moderate Sedation

## 2019-05-01 MED ORDER — SODIUM CHLORIDE 0.9 % IV SOLN
INTRAVENOUS | Status: DC
  Administered 2019-05-01: 20 mL/h via INTRAVENOUS

## 2019-05-01 MED ORDER — MIDAZOLAM HCL 5 MG/5ML IJ SOLN
INTRAMUSCULAR | Status: AC
  Filled 2019-05-01: qty 10

## 2019-05-01 MED ORDER — MEPERIDINE HCL 50 MG/ML IJ SOLN
INTRAMUSCULAR | Status: AC
  Filled 2019-05-01: qty 1

## 2019-05-01 MED ORDER — STERILE WATER FOR IRRIGATION IR SOLN
Status: DC | PRN
  Administered 2019-05-01: 1.5 mL

## 2019-05-01 MED ORDER — MIDAZOLAM HCL 5 MG/5ML IJ SOLN
INTRAMUSCULAR | Status: DC | PRN
  Administered 2019-05-01 (×2): 2 mg via INTRAVENOUS

## 2019-05-01 MED ORDER — MEPERIDINE HCL 50 MG/ML IJ SOLN
INTRAMUSCULAR | Status: DC | PRN
  Administered 2019-05-01 (×2): 25 mg via INTRAVENOUS

## 2019-05-01 NOTE — Discharge Instructions (Signed)
Resume usual medications as before. High-fiber diet. No driving for 24 hours. May wait 10 years before next colonoscopy.      Colonoscopy, Adult, Care After This sheet gives you information about how to care for yourself after your procedure. Your doctor may also give you more specific instructions. If you have problems or questions, call your doctor. What can I expect after the procedure? After the procedure, it is common to have:  A small amount of blood in your poop (stool) for 24 hours.  Some gas.  Mild cramping or bloating in your belly (abdomen). Follow these instructions at home: Eating and drinking   Drink enough fluid to keep your pee (urine) pale yellow.  Follow instructions from your doctor about what you cannot eat or drink.  Return to your normal diet as told by your doctor. Avoid heavy or fried foods that are hard to digest. Activity  Rest as told by your doctor.  Do not sit for a long time without moving. Get up to take short walks every 1-2 hours. This is important. Ask for help if you feel weak or unsteady.  Return to your normal activities as told by your doctor. Ask your doctor what activities are safe for you. To help cramping and bloating:   Try walking around.  Put heat on your belly as told by your doctor. Use the heat source that your doctor recommends, such as a moist heat pack or a heating pad. ? Put a towel between your skin and the heat source. ? Leave the heat on for 20-30 minutes. ? Remove the heat if your skin turns bright red. This is very important if you are unable to feel pain, heat, or cold. You may have a greater risk of getting burned. General instructions  For the first 24 hours after the procedure: ? Do not drive or use machinery. ? Do not sign important documents. ? Do not drink alcohol. ? Do your daily activities more slowly than normal. ? Eat foods that are soft and easy to digest.  Take over-the-counter or prescription  medicines only as told by your doctor.  Keep all follow-up visits as told by your doctor. This is important. Contact a doctor if:  You have blood in your poop 2-3 days after the procedure. Get help right away if:  You have more than a small amount of blood in your poop.  You see large clumps of tissue (blood clots) in your poop.  Your belly is swollen.  You feel like you may vomit (nauseous).  You vomit.  You have a fever.  You have belly pain that gets worse, and medicine does not help your pain. Summary  After the procedure, it is common to have a small amount of blood in your poop. You may also have mild cramping and bloating in your belly.  For the first 24 hours after the procedure, do not drive or use machinery, do not sign important documents, and do not drink alcohol.  Get help right away if you have a lot of blood in your poop, feel like you may vomit, have a fever, or have more belly pain. This information is not intended to replace advice given to you by your health care provider. Make sure you discuss any questions you have with your health care provider. Document Revised: 07/23/2018 Document Reviewed: 07/23/2018 Elsevier Patient Education  2020 [employer]     High-Fiber Diet Fiber, also called dietary fiber, is a type of carbohydrate  that is found in fruits, vegetables, whole grains, and beans. A high-fiber diet can have many health benefits. Your health care provider may recommend a high-fiber diet to help:  Prevent constipation. Fiber can make your bowel movements more regular.  Lower your cholesterol.  Relieve the following conditions: ? Swelling of veins in the anus (hemorrhoids). ? Swelling and irritation (inflammation) of specific areas of the digestive tract (uncomplicated diverticulosis). ? A problem of the large intestine (colon) that sometimes causes pain and diarrhea (irritable bowel syndrome, IBS).  Prevent overeating as part of a  weight-loss plan.  Prevent heart disease, type 2 diabetes, and certain cancers. What is my plan? The recommended daily fiber intake in grams (g) includes:  38 g for men age 80 or younger.  30 g for men over age 51.  25 g for women age 63 or younger.  21 g for women over age 67. You can get the recommended daily intake of dietary fiber by:  Eating a variety of fruits, vegetables, grains, and beans.  Taking a fiber supplement, if it is not possible to get enough fiber through your diet. What do I need to know about a high-fiber diet?  It is better to get fiber through food sources rather than from fiber supplements. There is not a lot of research about how effective supplements are.  Always check the fiber content on the nutrition facts label of any prepackaged food. Look for foods that contain 5 g of fiber or more per serving.  Talk with a diet and nutrition specialist (dietitian) if you have questions about specific foods that are recommended or not recommended for your medical condition, especially if those foods are not listed below.  Gradually increase how much fiber you consume. If you increase your intake of dietary fiber too quickly, you may have bloating, cramping, or gas.  Drink plenty of water. Water helps you to digest fiber. What are tips for following this plan?  Eat a wide variety of high-fiber foods.  Make sure that half of the grains that you eat each day are whole grains.  Eat breads and cereals that are made with whole-grain flour instead of refined flour or white flour.  Eat brown rice, bulgur wheat, or millet instead of white rice.  Start the day with a breakfast that is high in fiber, such as a cereal that contains 5 g of fiber or more per serving.  Use beans in place of meat in soups, salads, and pasta dishes.  Eat high-fiber snacks, such as berries, raw vegetables, nuts, and popcorn.  Choose whole fruits and vegetables instead of processed forms like  juice or sauce. What foods can I eat?  Fruits Berries. Pears. Apples. Oranges. Avocado. Prunes and raisins. Dried figs. Vegetables Sweet potatoes. Spinach. Kale. Artichokes. Cabbage. Broccoli. Cauliflower. Green peas. Carrots. Squash. Grains Whole-grain breads. Multigrain cereal. Oats and oatmeal. Brown rice. Barley. Bulgur wheat. Millet. Quinoa. Bran muffins. Popcorn. Rye wafer crackers. Meats and other proteins Navy, kidney, and pinto beans. Soybeans. Split peas. Lentils. Nuts and seeds. Dairy Fiber-fortified yogurt. Beverages Fiber-fortified soy milk. Fiber-fortified orange juice. Other foods Fiber bars. The items listed above may not be a complete list of recommended foods and beverages. Contact a dietitian for more options. What foods are not recommended? Fruits Fruit juice. Cooked, strained fruit. Vegetables Fried potatoes. Canned vegetables. Well-cooked vegetables. Grains White bread. Pasta made with refined flour. White rice. Meats and other proteins Fatty cuts of meat. Fried chicken or fried fish. Dairy  Milk. Yogurt. Cream cheese. Sour cream. Fats and oils Butters. Beverages Soft drinks. Other foods Cakes and pastries. The items listed above may not be a complete list of foods and beverages to avoid. Contact a dietitian for more information. Summary  Fiber is a type of carbohydrate. It is found in fruits, vegetables, whole grains, and beans.  There are many health benefits of eating a high-fiber diet, such as preventing constipation, lowering blood cholesterol, helping with weight loss, and reducing your risk of heart disease, diabetes, and certain cancers.  Gradually increase your intake of fiber. Increasing too fast can result in cramping, bloating, and gas. Drink plenty of water while you increase your fiber.  The best sources of fiber include whole fruits and vegetables, whole grains, nuts, seeds, and beans. This information is not intended to replace advice  given to you by your health care provider. Make sure you discuss any questions you have with your health care provider. Document Revised: 10/31/2016 Document Reviewed: 10/31/2016 Elsevier Patient Education  2020 [employer].

## 2019-05-01 NOTE — Op Note (Signed)
[facility] Patient Name: Raven Golden Procedure Date: 05/01/2019 9:35 AM MRN: [id number] Date of Birth: 1955-08-25 Attending MD: Lionel December , MD CSN: [id number] Age: 64 Admit Type: Outpatient Procedure:                Colonoscopy Indications:              High risk colon cancer surveillance: Personal                            history of colonic polyps Providers:                Lionel December, MD, Loma Messing B. Patsy Lager, RN, Edythe Clarity, Technician Referring MD:             Ignatius Specking, MD Medicines:                Meperidine 50 mg IV, Midazolam 4 mg IV Complications:            No immediate complications. Estimated Blood Loss:     Estimated blood loss: none. Procedure:                Pre-Anesthesia Assessment:                           - Prior to the procedure, a History and Physical                            was performed, and patient medications and                            allergies were reviewed. The patient's tolerance of                            previous anesthesia was also reviewed. The risks                            and benefits of the procedure and the sedation                            options and risks were discussed with the patient.                            All questions were answered, and informed consent                            was obtained. Prior Anticoagulants: The patient has                            taken no previous anticoagulant or antiplatelet                            agents. ASA Grade Assessment: II - A patient with  mild systemic disease. After reviewing the risks                            and benefits, the patient was deemed in                            satisfactory condition to undergo the procedure.                           After obtaining informed consent, the colonoscope                            was passed under direct vision. Throughout the                            procedure,  the patient's blood pressure, pulse, and                            oxygen saturations were monitored continuously. The                            [id number]-[id number] ([id number]) scope was introduced through                            the anus and advanced to the the cecum, identified                            by appendiceal orifice and ileocecal valve. The                            colonoscopy was performed without difficulty. The                            patient tolerated the procedure well. The quality                            of the bowel preparation was good. The ileocecal                            valve, appendiceal orifice, and rectum were                            photographed. Scope In: 9:55:51 AM Scope Out: 10:11:02 AM Scope Withdrawal Time: 0 hours 8 minutes 30 seconds  Total Procedure Duration: 0 hours 15 minutes 11 seconds  Findings:      Skin tags were found on perianal exam.      Scattered diverticula were found in the sigmoid colon.      The exam was otherwise normal throughout the examined colon.      External hemorrhoids were found during retroflexion. The hemorrhoids       were small. Impression:               - Perianal skin tags found on perianal exam.                           -  Diverticulosis in the sigmoid colon.                           - External hemorrhoids.                           - No specimens collected. Moderate Sedation:      Moderate (conscious) sedation was administered by the endoscopy nurse       and supervised by the endoscopist. The following parameters were       monitored: oxygen saturation, heart rate, blood pressure, CO2       capnography and response to care. Total physician intraservice time was       20 minutes. Recommendation:           - Patient has a contact number available for                            emergencies. The signs and symptoms of potential                            delayed complications were discussed with the                             patient. Return to normal activities tomorrow.                            Written discharge instructions were provided to the                            patient.                           - High fiber diet today.                           - Continue present medications.                           - Repeat colonoscopy in 10 years. Procedure Code(s):        --- Professional ---                           [phone], Colonoscopy, flexible; diagnostic, including                            collection of specimen(s) by brushing or washing,                            when performed (separate procedure)                           G0500, Moderate sedation services provided by the                            same physician or other qualified health care  professional performing a gastrointestinal                            endoscopic service that sedation supports,                            requiring the presence of an independent trained                            observer to assist in the monitoring of the                            patient's level of consciousness and physiological                            status; initial 15 minutes of intra-service time;                            patient age 68 years or older (additional time may                            be reported with 19379, as appropriate) Diagnosis Code(s):        --- Professional ---                           K64.4, Residual hemorrhoidal skin tags                           Z86.010, Personal history of colonic polyps                           K57.30, Diverticulosis of large intestine without                            perforation or abscess without bleeding CPT copyright 2019 American Medical Association. All rights reserved. The codes documented in this report are preliminary and upon coder review may  be revised to meet current compliance requirements. Lionel December, MD Lionel December, MD 05/01/2019  10:23:44 AM This report has been signed electronically. Number of Addenda: 0

## 2019-05-01 NOTE — H&P (Signed)
Raven Golden is an 64 y.o. female.   Chief Complaint: Patient is here for colonoscopy. HPI: Patient is 64 year old Caucasian female who has history of colonic polyps and is here for surveillance colonoscopy.  She denies abdominal pain change in bowel habits or rectal bleeding.  She had adenomas on her first exam but none on her second exam of November 2013 and she was advised to come back in 7 years. Family history is negative for CRC.  Past Medical History:  Diagnosis Date  . Chronic kidney disease   . Depression   . Tremors of nervous system     Past Surgical History:  Procedure Laterality Date  . ABDOMINAL HYSTERECTOMY    . BARTHOLIN CYST MARSUPIALIZATION    . CESAREAN SECTION  1983, 1985   X 2  . COLONOSCOPY  12/01/2011   Procedure: COLONOSCOPY;  Surgeon: Malissa Hippo, MD;  Location: [facility];  Service: Endoscopy;  Laterality: N/A;  1030    Family History  Problem Relation Age of Onset  . Tremor Mother 64  . Scleroderma Mother   . Heart disease Mother   . Stroke Father 41  . Healthy Sister   . Healthy Brother   . Healthy Son   . Colon cancer Neg Hx    Social History:  reports that she has never smoked. She has never used smokeless tobacco. She reports that she does not drink alcohol or use drugs.  Allergies: No Known Allergies  Facility-Administered Medications Prior to Admission  Medication Dose Route Frequency Provider Last Rate Last Admin  . incobotulinumtoxinA (XEOMIN) 100 units injection 100 Units  100 Units Intramuscular Q4 months Tat, Octaviano Batty, DO   100 Units at 11/16/18 1536  . incobotulinumtoxinA (XEOMIN) 100 units injection 140 Units  140 Units Intramuscular Q4 months Tat, Octaviano Batty, DO   300 Units at 03/22/19 0919   Medications Prior to Admission  Medication Sig Dispense Refill  . Cholecalciferol (VITAMIN D) 50 MCG (2000 UT) tablet Take 2,000 Units by mouth daily.    Marland Kitchen escitalopram (LEXAPRO) 10 MG tablet Take 10 mg by mouth daily.    Marland Kitchen estradiol  (ESTRACE) 1 MG tablet Take 1 mg by mouth daily.    . Multiple Vitamin (MULTIVITAMIN WITH MINERALS) TABS tablet Take 1 tablet by mouth daily.    . primidone (MYSOLINE) 50 MG tablet Take 100 mg by mouth daily.     . propranolol ER (INDERAL LA) 60 MG 24 hr capsule Take 60 mg by mouth daily.    . Red Yeast Rice 600 MG CAPS Take 600 mg by mouth daily.    Marland Kitchen tretinoin (RETIN-A) 0.05 % cream Apply 1 application topically at bedtime.    Marland Kitchen ibuprofen (ADVIL) 200 MG tablet Take 400 mg by mouth every 6 (six) hours as needed for headache or moderate pain.    . OnabotulinumtoxinA (BOTOX IJ) Inject 1 Dose as directed every 30 (thirty) days.      No results found for this or any previous visit (from the past 48 hour(s)). No results found.  Review of Systems  Blood pressure 120/65, pulse (!) 53, temperature 98 F (36.7 C), temperature source Oral, resp. rate 16, height 5\' 3"  (1.6 m), weight 55.8 kg, SpO2 100 %. Physical Exam  Constitutional:  Well-developed thin Caucasian female in NAD.  HENT:  Mouth/Throat: Oropharynx is clear and moist.  Eyes: Conjunctivae are normal. No scleral icterus.  Neck: No thyromegaly present.  Cardiovascular: Normal rate, regular rhythm and normal heart  sounds.  No murmur heard. Respiratory: Effort normal and breath sounds normal.  GI: Soft. She exhibits no distension and no mass. There is no abdominal tenderness.  Pfannenstiel scar.  Musculoskeletal:        General: No edema.  Lymphadenopathy:    She has no cervical adenopathy.  Neurological: She is alert.  Skin: Skin is warm and dry.     Assessment/Plan History of colonic adenomas Surveillance colonoscopy.  Hildred Laser, MD 05/01/2019, 9:47 AM

## 2019-05-30 NOTE — Progress Notes (Signed)
   Assessment/Plan:   1.  Cervical dystonia  -Last Botox on March 22, 2019.  -Add clonazepam, 0.25 mg at bedtime for a week and then if tolerated, she can increase to 0.25 mg twice daily.  Discussed risk, benefits, and side effects including potentially addictive nature.  Patient expressed understanding.  -We will again slightly increase Botox dosage and see how she does next time.   Subjective:   Raven Golden was seen today in follow up for cervical dystonia.  My previous records as well as any outside records available were reviewed prior to todays visit.  Last Botox was on March 22, 2019.  She reports that she did really well until May and then the tremor came back.    CURRENT MEDICATIONS:  Outpatient Encounter Medications as of 05/31/2019  Medication Sig  . Cholecalciferol (VITAMIN D) 50 MCG (2000 UT) tablet Take 2,000 Units by mouth daily.  Marland Kitchen escitalopram (LEXAPRO) 10 MG tablet Take 10 mg by mouth daily.  Marland Kitchen estradiol (ESTRACE) 1 MG tablet Take 1 mg by mouth daily.  Marland Kitchen ibuprofen (ADVIL) 200 MG tablet Take 400 mg by mouth every 6 (six) hours as needed for headache or moderate pain.  . Multiple Vitamin (MULTIVITAMIN WITH MINERALS) TABS tablet Take 1 tablet by mouth daily.  . OnabotulinumtoxinA (BOTOX IJ) Inject 1 Dose as directed every 30 (thirty) days.  . primidone (MYSOLINE) 50 MG tablet Take 100 mg by mouth daily.   . propranolol ER (INDERAL LA) 60 MG 24 hr capsule Take 60 mg by mouth daily.  . Red Yeast Rice 600 MG CAPS Take 600 mg by mouth daily.  Marland Kitchen tretinoin (RETIN-A) 0.05 % cream Apply 1 application topically at bedtime.   Facility-Administered Encounter Medications as of 05/31/2019  Medication  . incobotulinumtoxinA (XEOMIN) 100 units injection 100 Units  . incobotulinumtoxinA (XEOMIN) 100 units injection 140 Units     Objective:   PHYSICAL EXAMINATION:    VITALS:   Vitals:   05/31/19 1510  BP: 100/60  Pulse: 74  SpO2: 98%  Weight: 129 lb (58.5 kg)  Height: 5\' 3"   (1.6 m)    GEN:  The patient appears stated age and is in NAD. HEENT:  Normocephalic, atraumatic.  The mucous membranes are moist. The superficial temporal arteries are without ropiness or tenderness. CV:  RRR Lungs:  CTAB Neck/HEME:  There are no carotid bruits bilaterally.  Neurological examination:  Orientation: The patient is alert and oriented x3. Cranial nerves: There is good facial symmetry.The speech is fluent and clear. Soft palate rises symmetrically and there is no tongue deviation. Hearing is intact to conversational tone. Sensation: Sensation is intact to light touch throughout Motor: Strength is at least antigravity x4.  Movement examination: Tone: There is normal tone in the UE/LE Abnormal movements:  no tremor.  No myoclonus.  No asterixis.   Coordination:  There is no decremation with RAM's. Gait and Station: The patient has no difficulty arising out of a deep-seated chair without the use of the hands. The patient's stride length is good.      Total time spent on today's visit was 25 minutes, including both face-to-face time and nonface-to-face time.  Time included that spent on review of records (prior notes available to me/labs/imaging if pertinent), discussing treatment and goals, answering patient's questions and coordinating care.  Cc:  , MD

## 2019-05-31 ENCOUNTER — Other Ambulatory Visit: Payer: Self-pay

## 2019-05-31 ENCOUNTER — Encounter: Payer: Self-pay | Admitting: Neurology

## 2019-05-31 ENCOUNTER — Ambulatory Visit: Payer: BC Managed Care – PPO | Admitting: Neurology

## 2019-05-31 VITALS — BP 100/60 | HR 74 | Ht 63.0 in | Wt 129.0 lb

## 2019-05-31 DIAGNOSIS — G243 Spasmodic torticollis: Secondary | ICD-10-CM | POA: Diagnosis not present

## 2019-05-31 MED ORDER — CLONAZEPAM 0.5 MG PO TABS: 0.2500 mg | ORAL_TABLET | Freq: Two times a day (BID) | ORAL | 1 refills | Status: DC | PRN

## 2019-05-31 NOTE — Patient Instructions (Signed)
Take clonazepam, 0.5 mg, 1/2 tablet at bedtime for a week and then if tolerated, increase to 1/2 tablet twice per day (try this on a day when you aren't driving first)  The physicians and staff at [facility] are committed to providing excellent care. You may receive a survey requesting feedback about your experience at our office. We strive to receive "very good" responses to the survey questions. If you feel that your experience would prevent you from giving the office a "very good " response, please contact our office to try to remedy the situation. We may be reached at [phone]. Thank you for taking the time out of your busy day to complete the survey.

## 2019-06-28 ENCOUNTER — Ambulatory Visit: Payer: BC Managed Care – PPO | Admitting: Neurology

## 2019-07-08 ENCOUNTER — Encounter: Payer: Self-pay | Admitting: Neurology

## 2019-07-08 NOTE — Progress Notes (Signed)
Submitted BV through [employer]. Faxed on 07/08/19; awaiting response to see if we need to do a PA for Xeomin.

## 2019-07-11 NOTE — Progress Notes (Signed)
BV states patient can do Buy and bill or specialty pharm. Submitted paperwork through fax for PA. Awaiting on response from [facility].

## 2019-07-12 NOTE — Progress Notes (Signed)
Received fax from [employer] about approval for 4 visits/ effective 07/11/19 - 07/09/20. REF #: [id number]

## 2019-07-19 ENCOUNTER — Other Ambulatory Visit: Payer: Self-pay

## 2019-07-19 ENCOUNTER — Ambulatory Visit (INDEPENDENT_AMBULATORY_CARE_PROVIDER_SITE_OTHER): Payer: BC Managed Care – PPO | Admitting: Neurology

## 2019-07-19 DIAGNOSIS — G243 Spasmodic torticollis: Secondary | ICD-10-CM | POA: Diagnosis not present

## 2019-07-19 MED ORDER — INCOBOTULINUMTOXINA 100 UNITS IM SOLR
300.0000 [IU] | INTRAMUSCULAR | Status: AC
  Administered 2019-07-19: 300 [IU] via INTRAMUSCULAR

## 2019-07-19 NOTE — Procedures (Signed)
[facility]   Procedure Note Botox  Attending: Dr. Lurena Joiner Davetta Olliff  Preoperative Diagnosis(es): Cervical Dystonia   Consent obtained from: The patient Benefits discussed included, but were not limited to decreased muscle tightness, increased joint range of motion, and decreased pain.  Risk discussed included, but were not limited pain and discomfort, bleeding, bruising, excessive weakness, venous thrombosis, muscle atrophy and dysphagia.  A copy of the patient medication guide was given to the patient which explains the blackbox warning.  Patients identity and treatment sites confirmed Yes.  .  Details of Procedure: Skin was cleaned with alcohol.  A 30 gauge, 32mm  needle was introduced to the target muscle, except for posterior splenius where 27 gauge, 1.5 inch needle used.   Prior to injection, the needle plunger was aspirated to make sure the needle was not within a blood vessel.  There was no blood retrieved on aspiration.    Following is a summary of the muscles injected  And the amount of Botulinum toxin used:   Dilution 0.9% preservative free saline mixed with 100 u Botox type A to make 10 U per 0.1cc  Injections  Location Left  Right Units Number of sites        Sternocleidomastoid 60/20  80 2  Splenius Capitus, posterior approach  100 100 1  Splenius Capitus, lateral approach  60 60 1  Levator Scapulae      Trapezius 20 20/20 60 3 total        TOTAL UNITS:   60    Agent: Botulinum toxin type A (Xeomin).  3 vials of Botox were used, each containing 100 units and freshly diluted with 1 mL of sterile, non-preserved saline   Total injected (Units): 300  Total wasted (Units): 0   Pt tolerated procedure well without complications.   Reinjection is anticipated in 3 months.

## 2019-09-20 ENCOUNTER — Ambulatory Visit: Payer: BC Managed Care – PPO | Admitting: Neurology

## 2019-11-20 ENCOUNTER — Ambulatory Visit (INDEPENDENT_AMBULATORY_CARE_PROVIDER_SITE_OTHER): Payer: BC Managed Care – PPO | Admitting: Neurology

## 2019-11-20 ENCOUNTER — Other Ambulatory Visit: Payer: Self-pay

## 2019-11-20 DIAGNOSIS — G243 Spasmodic torticollis: Secondary | ICD-10-CM | POA: Diagnosis not present

## 2019-11-20 MED ORDER — INCOBOTULINUMTOXINA 100 UNITS IM SOLR
300.0000 [IU] | INTRAMUSCULAR | Status: AC
  Administered 2019-11-20: 300 [IU] via INTRAMUSCULAR

## 2019-11-20 NOTE — Procedures (Signed)
[facility]   Procedure Note Botox  Attending: Dr. Lurena Joiner Rian Busche  Preoperative Diagnosis(es): Cervical Dystonia  Results :  " I think we finally have the right dose." Patient only notices tremor when she has significant stress.   Consent obtained from: The patient Benefits discussed included, but were not limited to decreased muscle tightness, increased joint range of motion, and decreased pain.  Risk discussed included, but were not limited pain and discomfort, bleeding, bruising, excessive weakness, venous thrombosis, muscle atrophy and dysphagia.  A copy of the patient medication guide was given to the patient which explains the blackbox warning.  Patients identity and treatment sites confirmed Yes.  .  Details of Procedure: Skin was cleaned with alcohol.  A 30 gauge, 20mm  needle was introduced to the target muscle, except for posterior splenius where 27 gauge, 1.5 inch needle used.   Prior to injection, the needle plunger was aspirated to make sure the needle was not within a blood vessel.  There was no blood retrieved on aspiration.    Following is a summary of the muscles injected  And the amount of Botulinum toxin used:   Dilution 0.9% preservative free saline mixed with 100 u Botox type A to make 10 U per 0.1cc  Injections  Location Left  Right Units Number of sites        Sternocleidomastoid 60/20  80 2  Splenius Capitus, posterior approach  100 100 1  Splenius Capitus, lateral approach  60 60 1  Levator Scapulae      Trapezius 20 20/20 60 3 total        TOTAL UNITS:   300    Agent: Botulinum toxin type A (Xeomin).  3 vials of Botox were used, each containing 100 units and freshly diluted with 1 mL of sterile, non-preserved saline   Total injected (Units): 300  Total wasted (Units): 0   Pt tolerated procedure well without complications.   Reinjection is anticipated in 4 months.

## 2019-11-21 ENCOUNTER — Ambulatory Visit: Payer: BC Managed Care – PPO | Admitting: Neurology

## 2019-11-22 ENCOUNTER — Ambulatory Visit: Payer: BC Managed Care – PPO | Admitting: Neurology

## 2019-12-03 ENCOUNTER — Ambulatory Visit: Payer: BC Managed Care – PPO | Admitting: Neurology

## 2019-12-23 ENCOUNTER — Other Ambulatory Visit: Payer: Self-pay

## 2019-12-23 ENCOUNTER — Telehealth: Payer: Self-pay | Admitting: Neurology

## 2019-12-23 MED ORDER — CLONAZEPAM 0.5 MG PO TABS: 0.2500 mg | ORAL_TABLET | Freq: Two times a day (BID) | ORAL | 1 refills | Status: DC | PRN

## 2020-02-21 ENCOUNTER — Encounter: Payer: Self-pay | Admitting: Neurology

## 2020-02-21 NOTE — Progress Notes (Addendum)
Per BV for 2022: PA is required and [facility] is the [facility].   2/11- PA submitted. BV sent to scanning for her chart.   2/21- PA approval valid from 03/02/20 to 03/02/21. PA # [id number]. Will be faxing over approval letter today.

## 2020-03-27 ENCOUNTER — Other Ambulatory Visit: Payer: Self-pay

## 2020-03-27 ENCOUNTER — Ambulatory Visit (INDEPENDENT_AMBULATORY_CARE_PROVIDER_SITE_OTHER): Payer: BC Managed Care – PPO | Admitting: Neurology

## 2020-03-27 DIAGNOSIS — G243 Spasmodic torticollis: Secondary | ICD-10-CM | POA: Diagnosis not present

## 2020-03-27 MED ORDER — INCOBOTULINUMTOXINA 100 UNITS IM SOLR
300.0000 [IU] | INTRAMUSCULAR | Status: AC
  Administered 2020-03-27: 300 [IU] via INTRAMUSCULAR

## 2020-03-27 NOTE — Procedures (Signed)
[facility]   Procedure Note Botox  Attending: Dr. Lurena Joiner Tranice Laduke  Preoperative Diagnosis(es): Cervical Dystonia  Results :  Working well   Consent obtained from: The patient Benefits discussed included, but were not limited to decreased muscle tightness, increased joint range of motion, and decreased pain.  Risk discussed included, but were not limited pain and discomfort, bleeding, bruising, excessive weakness, venous thrombosis, muscle atrophy and dysphagia.  A copy of the patient medication guide was given to the patient which explains the blackbox warning.  Patients identity and treatment sites confirmed Yes.  .  Details of Procedure: Skin was cleaned with alcohol.  A 30 gauge, 7mm  needle was introduced to the target muscle, except for posterior splenius where 27 gauge, 1.5 inch needle used.   Prior to injection, the needle plunger was aspirated to make sure the needle was not within a blood vessel.  There was no blood retrieved on aspiration.    Following is a summary of the muscles injected  And the amount of Botulinum toxin used:   Dilution 0.9% preservative free saline mixed with 100 u Botox type A to make 10 U per 0.1cc  Injections  Location Left  Right Units Number of sites        Sternocleidomastoid 60/20  80 2  Splenius Capitus, posterior approach  100 100 1  Splenius Capitus, lateral approach  60 60 1  Levator Scapulae      Trapezius 20 20/20 60 3 total        TOTAL UNITS:   300    Agent: Botulinum toxin type A (Xeomin).  3 vials of Botox were used, each containing 100 units and freshly diluted with 1 mL of sterile, non-preserved saline   Total injected (Units): 300  Total wasted (Units): 0   Pt tolerated procedure well without complications.   Reinjection is anticipated in 4 months.

## 2020-06-12 ENCOUNTER — Telehealth: Payer: Self-pay

## 2020-06-12 NOTE — Telephone Encounter (Signed)
Message left please confirm delivery for Xeomin.  Called back [phone] delivery set for 06/18/20

## 2020-07-16 NOTE — Telephone Encounter (Signed)
Received Xeomin from SP it is in the cabinet next to office stock of Xeomin labeled with patient's name.

## 2020-07-31 ENCOUNTER — Ambulatory Visit: Payer: BC Managed Care – PPO | Admitting: Neurology

## 2020-07-31 ENCOUNTER — Other Ambulatory Visit: Payer: Self-pay

## 2020-07-31 DIAGNOSIS — G243 Spasmodic torticollis: Secondary | ICD-10-CM

## 2020-07-31 MED ORDER — INCOBOTULINUMTOXINA 100 UNITS IM SOLR
300.0000 [IU] | INTRAMUSCULAR | Status: AC
  Administered 2020-07-31: 300 [IU] via INTRAMUSCULAR

## 2020-07-31 MED ORDER — CLONAZEPAM 0.5 MG PO TABS: 0.2500 mg | ORAL_TABLET | Freq: Two times a day (BID) | ORAL | 1 refills | Status: DC | PRN

## 2020-07-31 NOTE — Procedures (Signed)
[facility]   Procedure Note Botox  Attending: Dr. Lurena Joiner Cooper Stamp  Preoperative Diagnosis(es): Cervical Dystonia  Results :  Working well   Consent obtained from: The patient Benefits discussed included, but were not limited to decreased muscle tightness, increased joint range of motion, and decreased pain.  Risk discussed included, but were not limited pain and discomfort, bleeding, bruising, excessive weakness, venous thrombosis, muscle atrophy and dysphagia.  A copy of the patient medication guide was given to the patient which explains the blackbox warning.  Patients identity and treatment sites confirmed Yes.  .  Details of Procedure: Skin was cleaned with alcohol.  A 30 gauge, 64mm  needle was introduced to the target muscle, except for posterior splenius where 27 gauge, 1.5 inch needle used.   Prior to injection, the needle plunger was aspirated to make sure the needle was not within a blood vessel.  There was no blood retrieved on aspiration.    Following is a summary of the muscles injected  And the amount of Botulinum toxin used:   Dilution 0.9% preservative free saline mixed with 100 u Botox type A to make 10 U per 0.1cc  Injections  Location Left  Right Units Number of sites        Sternocleidomastoid 60/20  80 2  Splenius Capitus, posterior approach  100 100 1  Splenius Capitus, lateral approach  60 60 1  Levator Scapulae      Trapezius 20 20/20 60 3 total        TOTAL UNITS:   300    Agent: Botulinum toxin type A (Xeomin).  3 vials of Botox were used, each containing 100 units and freshly diluted with 1 mL of sterile, non-preserved saline   Total injected (Units): 300  Total wasted (Units): 0   Pt tolerated procedure well without complications.   Reinjection is anticipated in 4 months.

## 2020-09-21 ENCOUNTER — Telehealth: Payer: Self-pay | Admitting: Neurology

## 2020-09-21 NOTE — Telephone Encounter (Signed)
Patient returned call and I informed patient that nothing has been changed on our end and we still have her for Xeomin. Informed patient that Xeomin was her insurance preference. Advised patient to call her insurance and speak to them in regards to her co-pay. Patient stated she will give them a call and call us back if we need to do anything our end. Patient had no further questions or concerns.

## 2020-09-21 NOTE — Telephone Encounter (Signed)
Called patient and left a message for a call back.  

## 2020-09-21 NOTE — Telephone Encounter (Signed)
Patient states that she is confused she wants to make sure that she is still getting Xeomin injections and not a different type of Drug. She states that insurance is now wanting to charge her like $ 400.00 for the copay.   She wants to know what she can do about the copay being so much and make sure which Drug she is getting    Please call

## 2020-10-31 ENCOUNTER — Other Ambulatory Visit: Payer: Self-pay | Admitting: Neurology

## 2020-11-02 NOTE — Telephone Encounter (Signed)
Pt called, would ike a refill on her clonazepam. [place] Spaniel she is completely out

## 2020-11-20 ENCOUNTER — Telehealth: Payer: Self-pay

## 2020-11-20 NOTE — Telephone Encounter (Signed)
New message   I called the patient at  [phone] to contact [facility] for consent on Xeomin.

## 2020-11-23 ENCOUNTER — Telehealth: Payer: Self-pay | Admitting: Neurology

## 2020-11-23 NOTE — Telephone Encounter (Signed)
Pt called in and left a message wanting to find out which pharmacy we get her Xeomin from?

## 2020-11-23 NOTE — Telephone Encounter (Signed)
F/u   I called [facility] [phone] to give consent delivery Xeomin   Delivery Date on 11/30/20

## 2020-11-23 NOTE — Telephone Encounter (Signed)
Called patient and LVM to call [facility] to give consent for the delivery to be arranged to come to our office

## 2020-12-11 ENCOUNTER — Ambulatory Visit: Payer: BC Managed Care – PPO | Admitting: Neurology

## 2020-12-11 ENCOUNTER — Other Ambulatory Visit: Payer: Self-pay

## 2020-12-11 DIAGNOSIS — G243 Spasmodic torticollis: Secondary | ICD-10-CM | POA: Diagnosis not present

## 2020-12-11 MED ORDER — INCOBOTULINUMTOXINA 100 UNITS IM SOLR
300.0000 [IU] | INTRAMUSCULAR | Status: AC
  Administered 2020-12-11: 300 [IU] via INTRAMUSCULAR

## 2020-12-11 NOTE — Procedures (Signed)
[facility]   Procedure Note Botox  Attending: Dr. Lurena Joiner Triniti Gruetzmacher  Preoperative Diagnosis(es): Cervical Dystonia  Results :  Working well - started to wear off about 3 weeks ago   Consent obtained from: The patient Benefits discussed included, but were not limited to decreased muscle tightness, increased joint range of motion, and decreased pain.  Risk discussed included, but were not limited pain and discomfort, bleeding, bruising, excessive weakness, venous thrombosis, muscle atrophy and dysphagia.  A copy of the patient medication guide was given to the patient which explains the blackbox warning.  Patients identity and treatment sites confirmed Yes.  .  Details of Procedure: Skin was cleaned with alcohol.  A 30 gauge, 61mm  needle was introduced to the target muscle, except for posterior splenius where 27 gauge, 1.5 inch needle used.   Prior to injection, the needle plunger was aspirated to make sure the needle was not within a blood vessel.  There was no blood retrieved on aspiration.    Following is a summary of the muscles injected  And the amount of Botulinum toxin used:   Dilution 0.9% preservative free saline mixed with 100 u Botox type A to make 10 U per 0.1cc  Injections  Location Left  Right Units Number of sites        Sternocleidomastoid 60/20  80 2  Splenius Capitus, posterior approach  100 100 1  Splenius Capitus, lateral approach  60 60 1  Levator Scapulae      Trapezius 20 20/20 60 3 total        TOTAL UNITS:   300    Agent: Botulinum toxin type A (Xeomin).  3 vials of Botox were used, each containing 100 units and freshly diluted with 1 mL of sterile, non-preserved saline   Total injected (Units): 300  Total wasted (Units): 0   Pt tolerated procedure well without complications.   Reinjection is anticipated in 4 months.

## 2021-01-25 ENCOUNTER — Other Ambulatory Visit (HOSPITAL_COMMUNITY): Payer: Self-pay

## 2021-02-05 ENCOUNTER — Other Ambulatory Visit: Payer: Self-pay

## 2021-02-05 DIAGNOSIS — G243 Spasmodic torticollis: Secondary | ICD-10-CM

## 2021-02-05 MED ORDER — XEOMIN 100 UNITS IM SOLR: INTRAMUSCULAR | 2 refills | Status: AC

## 2021-02-08 ENCOUNTER — Other Ambulatory Visit (HOSPITAL_COMMUNITY): Payer: Self-pay

## 2021-02-08 ENCOUNTER — Telehealth: Payer: Self-pay

## 2021-02-08 NOTE — Telephone Encounter (Signed)
New message   Submit prior authorization   Website [employer]   Xeomin medication

## 2021-02-10 ENCOUNTER — Other Ambulatory Visit: Payer: Self-pay | Admitting: Neurology

## 2021-02-15 ENCOUNTER — Telehealth: Payer: Self-pay | Admitting: Neurology

## 2021-02-15 NOTE — Telephone Encounter (Signed)
Called pharmacy prescription is ready for patient

## 2021-02-15 NOTE — Telephone Encounter (Signed)
1. Which medications need refilled? (List name and dosage, if known) clonazepam .5 MG  2. Which pharmacy/location is medication to be sent to? (include street and city if local pharmacy) [facility]  3. Do they need a 30 day or 90 day supply? 90 days

## 2021-02-26 ENCOUNTER — Telehealth: Payer: Self-pay

## 2021-02-26 NOTE — Telephone Encounter (Signed)
F/u   Receive fax from [employer] for enrollment in the [place] Patient Saving Program has been approved . You may now be eligible to receive up to $ 5,000 to assist with your out-of-pocket Xeomin medication cost and eligible related administration fees for the remainder of the calendar year.

## 2021-02-26 NOTE — Telephone Encounter (Signed)
F/u  Call Pharmacy Help Desk  [phone]  Spoke with Crystal from [employer] on prior authorization   Medication Xeomin 100 Quantity 3  No prior authorization required .

## 2021-02-26 NOTE — Telephone Encounter (Signed)
New message   [employer] is unable to respond with clinical questions. Please see more information at the bottom of the page for next steps.  Zurri Stanish Key: [id number] help? Call us at [phone]  Outcome Additional Information Required  Please reach out to the Pharmacy Help Desk for further assistance [phone]  Drug Xeomin 100UNIT solution  Quantity 3  Form [employer] Electronic PA Form [phone] NCPDP)

## 2021-04-01 ENCOUNTER — Ambulatory Visit (INDEPENDENT_AMBULATORY_CARE_PROVIDER_SITE_OTHER): Payer: BC Managed Care – PPO | Admitting: Gastroenterology

## 2021-04-01 ENCOUNTER — Other Ambulatory Visit: Payer: Self-pay

## 2021-04-01 ENCOUNTER — Other Ambulatory Visit (INDEPENDENT_AMBULATORY_CARE_PROVIDER_SITE_OTHER): Payer: Self-pay

## 2021-04-01 ENCOUNTER — Encounter (INDEPENDENT_AMBULATORY_CARE_PROVIDER_SITE_OTHER): Payer: Self-pay

## 2021-04-01 ENCOUNTER — Encounter (INDEPENDENT_AMBULATORY_CARE_PROVIDER_SITE_OTHER): Payer: Self-pay | Admitting: Gastroenterology

## 2021-04-01 DIAGNOSIS — R1310 Dysphagia, unspecified: Secondary | ICD-10-CM | POA: Diagnosis not present

## 2021-04-01 NOTE — Progress Notes (Signed)
Raven Golden, M.D. ?Gastroenterology & Hepatology ?[facility] ?[address] ?[place], [place] [address] ? ?Primary Care Physician: ?Glenda Chroman, MD ?[address] ?[place] [address] ? ?I will communicate my assessment and recommendations to the referring MD via EMR. ? ?Problems: ?Dysphagia ? ?History of Present Illness: ?Raven Golden is a 66 y.o. female with Pmh Raynaud's disease, depression, chronic kidney disease and cervical dystonia, who presents for evaluation of dysphagia. ? ?The patient was last seen on 05/01/2019. At that time, the patient underwent colonoscopy with findings grade below. ? ?Patient reports that for the last 6 months she has presented recurrent episodes of dysphagia when swallowing solids such as chicken or beef. These episodes are intermittent and are more frequent when she does not chew the food thoroughly. Occasionally she has some choking episodes when swallowing liquids but this is not frequent. Denies any heartburn or odynophagia. No pill dysphagia. The patient denies having any nausea, vomiting, fever, chills, hematochezia, melena, hematemesis, abdominal distention, abdominal pain, diarrhea, jaundice, pruritus or weight loss. ? ?She reports her mother had scleroderma and developed similar symptoms. States that she has been seen by a rheumatologist in the past and has been checked for scleroderma, which was negative. ? ?She has been receiving Xeomin (incobotulinum toxin A) for cervical dystonia. Takes it every 3 months. ? ?Last EGD: never ?Last Colonoscopy: 05/01/2019, and external hemorrhoids.  Recommended to have repeat colonoscopy in 10 years. ? ?Past Medical History: ?Past Medical History:  ?Diagnosis Date  ? Chronic kidney disease   ? Depression   ? Tremors of nervous system   ? ? ?Past Surgical History: ?Past Surgical History:  ?Procedure Laterality Date  ? ABDOMINAL HYSTERECTOMY    ? BARTHOLIN CYST MARSUPIALIZATION    ?  [place]  ? X 2  ? COLONOSCOPY  12/01/2011  ? Procedure: COLONOSCOPY;  Surgeon: Rogene Houston, MD;  [facility];  Service: Endoscopy;  Laterality: N/A;  1030  ? COLONOSCOPY N/A 05/01/2019  ? Procedure: COLONOSCOPY;  Surgeon: Rogene Houston, MD;  Location: [facility];  Service: Endoscopy;  Laterality: N/A;  1055  ? ? ?Family History: ?Family History  ?Problem Relation Age of Onset  ? Tremor Mother 17  ? Scleroderma Mother   ? Heart disease Mother   ? Stroke Father 68  ? Healthy Sister   ? Healthy Brother   ? Healthy Son   ? Colon cancer Neg Hx   ? ? ?Social History: ?Social History  ? ?Tobacco Use  ?Smoking Status Never  ?Smokeless Tobacco Never  ? ?Social History  ? ?Substance and Sexual Activity  ?Alcohol Use No  ? ?Social History  ? ?Substance and Sexual Activity  ?Drug Use No  ? ? ?Allergies: ?No Known Allergies ? ?Medications: ?Current Outpatient Medications  ?Medication Sig Dispense Refill  ? Cholecalciferol (VITAMIN D) 50 MCG (2000 UT) tablet Take 2,000 Units by mouth daily.    ? clonazePAM (KLONOPIN) 0.5 MG tablet TAKE 1/2 TABLET BY MOUTH TWICE DAILY AS NEEDED 90 tablet 0  ? escitalopram (LEXAPRO) 10 MG tablet Take 10 mg by mouth daily.    ? estradiol (ESTRACE) 1 MG tablet Take 1 mg by mouth daily.    ? ibuprofen (ADVIL) 200 MG tablet Take 400 mg by mouth every 6 (six) hours as needed for headache or moderate pain.    ? incobotulinumtoxinA (XEOMIN) 100 units SOLR injection Inject 300 units into the neck muscle  every 90 days 3 each 2  ? linaclotide (LINZESS) 145 MCG CAPS capsule Take 145 mcg by mouth daily before breakfast.    ? Multiple Vitamin (MULTIVITAMIN WITH MINERALS) TABS tablet Take 1 tablet by mouth daily.    ? primidone (MYSOLINE) 50 MG tablet Take 100 mg by mouth daily.     ? propranolol ER (INDERAL LA) 60 MG 24 hr capsule Take 60 mg by mouth daily.    ? rosuvastatin (CRESTOR) 10 MG tablet Take 10 mg by mouth daily.    ? tretinoin (RETIN-A) 0.05 % cream Apply 1  application topically at bedtime.    ? OnabotulinumtoxinA (BOTOX IJ) Inject 1 Dose as directed every 30 (thirty) days. (Patient not taking: Reported on 04/01/2021)    ? ?Current Facility-Administered Medications  ?Medication Dose Route Frequency Provider Last Rate Last Admin  ? incobotulinumtoxinA (XEOMIN) 100 units injection 100 Units  100 Units Intramuscular Q4 months Tat, Octaviano Batty, DO   100 Units at 11/16/18 1536  ? incobotulinumtoxinA (XEOMIN) 100 units injection 140 Units  140 Units Intramuscular Q4 months Tat, Octaviano Batty, DO   300 Units at 03/22/19 0919  ? incobotulinumtoxinA (XEOMIN) 100 units injection 300 Units  300 Units Intramuscular Q90 days Tat, Octaviano Batty, DO   300 Units at 07/19/19 1153  ? incobotulinumtoxinA (XEOMIN) 100 units injection 300 Units  300 Units Intramuscular Q90 days Tat, Octaviano Batty, DO   300 Units at 11/20/19 1051  ? incobotulinumtoxinA (XEOMIN) 100 units injection 300 Units  300 Units Intramuscular Q90 days Tat, Octaviano Batty, DO   300 Units at 03/27/20 1113  ? incobotulinumtoxinA (XEOMIN) 100 units injection 300 Units  300 Units Intramuscular Q90 days Tat, Octaviano Batty, DO   300 Units at 07/31/20 1026  ? incobotulinumtoxinA (XEOMIN) 100 units injection 300 Units  300 Units Intramuscular Q90 days Tat, Octaviano Batty, DO   300 Units at 12/11/20 1026  ? ? ?Review of Systems: ?GENERAL: negative for malaise, night sweats ?HEENT: No changes in hearing or vision, no nose bleeds or other nasal problems. ?NECK: Negative for lumps, goiter, pain and significant neck swelling ?RESPIRATORY: Negative for cough, wheezing ?CARDIOVASCULAR: Negative for chest pain, leg swelling, palpitations, orthopnea ?GI: SEE HPI ?MUSCULOSKELETAL: Negative for joint pain or swelling, back pain, and muscle pain. ?SKIN: Negative for lesions, rash ?PSYCH: Negative for sleep disturbance, mood disorder and recent psychosocial stressors. ?HEMATOLOGY Negative for prolonged bleeding, bruising easily, and swollen nodes. ?ENDOCRINE: Negative  for cold or heat intolerance, polyuria, polydipsia and goiter. ?NEURO: negative for tremor, gait imbalance, syncope and seizures. ?The remainder of the review of systems is noncontributory. ? ? ?Physical Exam: ?BP (!) 88/51 (BP Location: Left Arm, Patient Position: Sitting, Cuff Size: Small)   Pulse (!) 57   Temp (!) 97.4 ?F (36.3 ?C) (Oral)   Ht 5' 3.5" (1.613 m)   Wt 129 lb 9.6 oz (58.8 kg)   BMI 22.60 kg/m?  ?GENERAL: The patient is AO x3, in no acute distress. ?HEENT: Head is normocephalic and atraumatic. EOMI are intact. Mouth is well hydrated and without lesions. ?NECK: Supple. No masses ?LUNGS: Clear to auscultation. No presence of rhonchi/wheezing/rales. Adequate chest expansion ?HEART: RRR, normal s1 and s2. ?ABDOMEN: Soft, nontender, no guarding, no peritoneal signs, and nondistended. BS +. No masses. ?EXTREMITIES: Without any cyanosis, clubbing, rash, lesions or edema. ?NEUROLOGIC: AOx3, no focal motor deficit. ?SKIN: no jaundice, no rashes ? ?Imaging/Labs: ?as above ? ?I personally reviewed and interpreted the available labs, imaging and endoscopic files. ? ?Impression  and Plan: ?Raven Golden is a 66 y.o. female with Pmh Raynaud's disease, depression, chronic kidney disease and cervical dystonia, who presents for evaluation of dysphagia.  The patient has presented new onset dysphagia to solids of unclear etiology.  Only possible contributing factor to her current presentation is the use of botulinum toxin for cervical dystonia.  I asked the patient to make a note if her symptoms worsen after she gets her next botulinum toxin injection.  For now, we will evaluate her symptoms further with an EGD with possible dilation.  Patient understood and agreed. ? ?- Schedule EGD  ?- Patient to make a note about symptom worsening at the time of Botox injection ? ?All questions were answered.     ? ?Harvel Quale, MD ?Gastroenterology and Hepatology ?[facility] ? ?

## 2021-04-01 NOTE — Patient Instructions (Signed)
Schedule EGD  ?Please make a note about symptom worsening at the time of Botox injection ?

## 2021-04-29 ENCOUNTER — Ambulatory Visit: Payer: BC Managed Care – PPO | Admitting: Neurology

## 2021-04-29 DIAGNOSIS — G243 Spasmodic torticollis: Secondary | ICD-10-CM

## 2021-04-29 NOTE — Procedures (Signed)
[facility]  ? ?Procedure Note Botox ? ?Attending: Dr. Wells Guiles Zavier Canela ? ?Preoperative Diagnosis(es): Cervical Dystonia ? ?Results :  doing well ? ? ?Consent obtained from: The patient ?Benefits discussed included, but were not limited to decreased muscle tightness, increased joint range of motion, and decreased pain.  Risk discussed included, but were not limited pain and discomfort, bleeding, bruising, excessive weakness, venous thrombosis, muscle atrophy and dysphagia.  A copy of the patient medication guide was given to the patient which explains the blackbox warning. ? ?Patients identity and treatment sites confirmed Yes.  . ? ?Details of Procedure: ?Skin was cleaned with alcohol.  A 30 gauge, 36mm  needle was introduced to the target muscle, except for posterior splenius where 27 gauge, 1.5 inch needle used.   Prior to injection, the needle plunger was aspirated to make sure the needle was not within a blood vessel.  There was no blood retrieved on aspiration.   ? ?Following is a summary of the muscles injected  And the amount of Botulinum toxin used: ? ? ?Dilution ?0.9% preservative free saline mixed with 100 u Botox type A to make 10 U per 0.1cc ? ?Injections  ?Location Left  Right Units Number of sites  ?      ?Sternocleidomastoid 60/20  80 2  ?Splenius Capitus, posterior approach  100 100 1  ?Splenius Capitus, lateral approach  60 60 1  ?Levator Scapulae      ?Trapezius 20 20/20 60 3 total  ?      ?TOTAL UNITS:   300   ? ?Agent: Botulinum toxin type A (Xeomin).  3 vials of Botox were used, each containing 100 units and freshly diluted with 1 mL of sterile, non-preserved saline ? ? Total injected (Units): 300 ? Total wasted (Units): 0 ? ? ?Pt tolerated procedure well without complications.   ?Reinjection is anticipated in 4 months. ?

## 2021-05-05 ENCOUNTER — Other Ambulatory Visit: Payer: Self-pay

## 2021-05-05 ENCOUNTER — Ambulatory Visit (HOSPITAL_COMMUNITY): Payer: BC Managed Care – PPO | Admitting: Anesthesiology

## 2021-05-05 ENCOUNTER — Encounter (HOSPITAL_COMMUNITY): Payer: Self-pay | Admitting: Gastroenterology

## 2021-05-05 ENCOUNTER — Encounter (HOSPITAL_COMMUNITY)
Admission: RE | Disposition: A | Discharge status: Home / Self Care | Payer: Self-pay | Source: Home / Self Care | Attending: Gastroenterology

## 2021-05-05 ENCOUNTER — Ambulatory Visit (HOSPITAL_COMMUNITY)
Admission: RE | Admit: 2021-05-05 | Discharge: 2021-05-05 | Disposition: A | Discharge status: Home / Self Care | Payer: BC Managed Care – PPO | Attending: Gastroenterology | Admitting: Gastroenterology

## 2021-05-05 DIAGNOSIS — Z79899 Other long term (current) drug therapy: Secondary | ICD-10-CM | POA: Insufficient documentation

## 2021-05-05 DIAGNOSIS — K2980 Duodenitis without bleeding: Secondary | ICD-10-CM | POA: Diagnosis not present

## 2021-05-05 DIAGNOSIS — N189 Chronic kidney disease, unspecified: Secondary | ICD-10-CM | POA: Insufficient documentation

## 2021-05-05 DIAGNOSIS — K2970 Gastritis, unspecified, without bleeding: Secondary | ICD-10-CM | POA: Diagnosis not present

## 2021-05-05 DIAGNOSIS — R1310 Dysphagia, unspecified: Secondary | ICD-10-CM | POA: Insufficient documentation

## 2021-05-05 DIAGNOSIS — G243 Spasmodic torticollis: Secondary | ICD-10-CM | POA: Insufficient documentation

## 2021-05-05 DIAGNOSIS — F32A Depression, unspecified: Secondary | ICD-10-CM | POA: Diagnosis not present

## 2021-05-05 HISTORY — PX: ESOPHAGEAL DILATION: SHX303

## 2021-05-05 HISTORY — PX: ESOPHAGOGASTRODUODENOSCOPY (EGD) WITH PROPOFOL: SHX5813

## 2021-05-05 SURGERY — ESOPHAGOGASTRODUODENOSCOPY (EGD) WITH PROPOFOL
Anesthesia: General

## 2021-05-05 MED ORDER — PROPOFOL 500 MG/50ML IV EMUL
INTRAVENOUS | Status: DC | PRN
  Administered 2021-05-05: 180 ug/kg/min via INTRAVENOUS

## 2021-05-05 MED ORDER — PHENYLEPHRINE HCL (PRESSORS) 10 MG/ML IV SOLN
INTRAVENOUS | Status: DC | PRN
  Administered 2021-05-05: 50 ug via INTRAVENOUS

## 2021-05-05 MED ORDER — PROPOFOL 500 MG/50ML IV EMUL
INTRAVENOUS | Status: AC
  Filled 2021-05-05: qty 50

## 2021-05-05 MED ORDER — PROPOFOL 10 MG/ML IV BOLUS
INTRAVENOUS | Status: DC | PRN
  Administered 2021-05-05: 100 mg via INTRAVENOUS

## 2021-05-05 MED ORDER — LACTATED RINGERS IV SOLN: INTRAVENOUS | Status: DC

## 2021-05-05 NOTE — H&P (Signed)
Raven Golden is an 66 y.o. female.   ?Chief Complaint: dysphagia ?HPI: Raven Golden is a 66 y.o. female with Pmh Raynaud's disease, depression, chronic kidney disease and cervical dystonia, who presents for evaluation of dysphagia. ? ?Patient reports persistent dysphagia without heartburn or odynophagia. The patient denies having any nausea, vomiting, fever, chills, hematochezia, melena, hematemesis, abdominal distention, abdominal pain, diarrhea, jaundice, pruritus or weight loss. ? ?Past Medical History:  ?Diagnosis Date  ? Chronic kidney disease   ? Depression   ? Tremors of nervous system   ? ? ?Past Surgical History:  ?Procedure Laterality Date  ? ABDOMINAL HYSTERECTOMY    ? BARTHOLIN CYST MARSUPIALIZATION    ? CESAREAN SECTION  1983, 1985  ? X 2  ? COLONOSCOPY  12/01/2011  ? Procedure: COLONOSCOPY;  Surgeon: Malissa Hippo, MD;  [facility]: [facility];  Service: Endoscopy;  Laterality: N/A;  1030  ? COLONOSCOPY N/A 05/01/2019  ? Procedure: COLONOSCOPY;  Surgeon: Malissa Hippo, MD;  Location: [facility];  Service: Endoscopy;  Laterality: N/A;  1055  ? ? ?Family History  ?Problem Relation Age of Onset  ? Tremor Mother 40  ? Scleroderma Mother   ? Heart disease Mother   ? Stroke Father 98  ? Healthy Sister   ? Healthy Brother   ? Healthy Son   ? Colon cancer Neg Hx   ? ?Social History:  reports that she has never smoked. She has never used smokeless tobacco. She reports that she does not drink alcohol and does not use drugs. ? ?Allergies: No Known Allergies ? ?Facility-Administered Medications Prior to Admission  ?Medication Dose Route Frequency Provider Last Rate Last Admin  ? incobotulinumtoxinA (XEOMIN) 100 units injection 100 Units  100 Units Intramuscular Q4 months Tat, Octaviano Batty, DO   100 Units at 11/16/18 1536  ? incobotulinumtoxinA (XEOMIN) 100 units injection 140 Units  140 Units Intramuscular Q4 months Tat, Octaviano Batty, DO   300 Units at 03/22/19 0919  ? incobotulinumtoxinA (XEOMIN) 100 units  injection 300 Units  300 Units Intramuscular Q90 days Tat, Octaviano Batty, DO   300 Units at 07/19/19 1153  ? incobotulinumtoxinA (XEOMIN) 100 units injection 300 Units  300 Units Intramuscular Q90 days Tat, Octaviano Batty, DO   300 Units at 11/20/19 1051  ? incobotulinumtoxinA (XEOMIN) 100 units injection 300 Units  300 Units Intramuscular Q90 days Tat, Octaviano Batty, DO   300 Units at 03/27/20 1113  ? incobotulinumtoxinA (XEOMIN) 100 units injection 300 Units  300 Units Intramuscular Q90 days Tat, Octaviano Batty, DO   300 Units at 07/31/20 1026  ? incobotulinumtoxinA (XEOMIN) 100 units injection 300 Units  300 Units Intramuscular Q90 days Tat, Octaviano Batty, DO   300 Units at 12/11/20 1026  ? ?Medications Prior to Admission  ?Medication Sig Dispense Refill  ? Cholecalciferol (VITAMIN D) 50 MCG (2000 UT) tablet Take 2,000 Units by mouth daily.    ? clonazePAM (KLONOPIN) 0.5 MG tablet TAKE 1/2 TABLET BY MOUTH TWICE DAILY AS NEEDED (Patient taking differently: Take 0.25 mg by mouth 2 (two) times daily.) 90 tablet 0  ? escitalopram (LEXAPRO) 10 MG tablet Take 10 mg by mouth daily.    ? estradiol (ESTRACE) 1 MG tablet Take 1 mg by mouth daily.    ? ibuprofen (ADVIL) 200 MG tablet Take 400 mg by mouth every 6 (six) hours as needed for headache or moderate pain.    ? incobotulinumtoxinA (XEOMIN) 100 units SOLR injection Inject 300 units into the  neck muscle every 90 days (Patient taking differently: Inject 300 units into the neck muscle every 4 months) 3 each 2  ? linaclotide (LINZESS) 145 MCG CAPS capsule Take 145 mcg by mouth daily before breakfast.    ? primidone (MYSOLINE) 50 MG tablet Take 50 mg by mouth daily.    ? propranolol ER (INDERAL LA) 60 MG 24 hr capsule Take 60 mg by mouth daily.    ? rosuvastatin (CRESTOR) 10 MG tablet Take 10 mg by mouth daily.    ? tretinoin (RETIN-A) 0.05 % cream Apply 1 application. topically daily as needed (wrinkles).    ? ? ?No results found for this or any previous visit (from the past 48 hour(s)). ?No  results found. ? ?Review of Systems  ?Constitutional: Negative.   ?HENT:  Positive for trouble swallowing.   ?Eyes: Negative.   ?Respiratory: Negative.    ?Cardiovascular: Negative.   ?Gastrointestinal: Negative.   ?Endocrine: Negative.   ?Genitourinary: Negative.   ?Musculoskeletal: Negative.   ?Skin: Negative.   ?Allergic/Immunologic: Negative.   ?Neurological: Negative.   ?Hematological: Negative.   ?Psychiatric/Behavioral: Negative.    ? ?Blood pressure 114/66, temperature 97.8 ?F (36.6 ?C), temperature source Oral, resp. rate 12, height 5' 3.5" (1.613 m), weight 56.7 kg, SpO2 100 %. ?Physical Exam  ?GENERAL: The patient is AO x3, in no acute distress. ?HEENT: Head is normocephalic and atraumatic. EOMI are intact. Mouth is well hydrated and without lesions. ?NECK: Supple. No masses ?LUNGS: Clear to auscultation. No presence of rhonchi/wheezing/rales. Adequate chest expansion ?HEART: RRR, normal s1 and s2. ?ABDOMEN: Soft, nontender, no guarding, no peritoneal signs, and nondistended. BS +. No masses. ?EXTREMITIES: Without any cyanosis, clubbing, rash, lesions or edema. ?NEUROLOGIC: AOx3, no focal motor deficit. ?SKIN: no jaundice, no rashes ? ?Assessment/Plan ?Raven Golden is a 66 y.o. female with Pmh Raynaud's disease, depression, chronic kidney disease and cervical dystonia, who presents for evaluation of dysphagia.  We will proceed with EGD. ? ?Dolores Frame, MD ?05/05/2021, 9:38 AM ? ? ? ?

## 2021-05-05 NOTE — Discharge Instructions (Signed)
You are being discharged to home.  Resume your previous diet.  We are waiting for your pathology results.  

## 2021-05-05 NOTE — Anesthesia Postprocedure Evaluation (Signed)
Anesthesia Post Note ? ?Patient: Raven Golden ? ?Procedure(s) Performed: ESOPHAGOGASTRODUODENOSCOPY (EGD) WITH PROPOFOL ?ESOPHAGEAL DILATION ? ?Patient location during evaluation: Phase II ?Anesthesia Type: General ?Level of consciousness: awake and alert and oriented ?Pain management: pain level controlled ?Vital Signs Assessment: post-procedure vital signs reviewed and stable ?Respiratory status: spontaneous breathing, nonlabored ventilation and respiratory function stable ?Cardiovascular status: blood pressure returned to baseline and stable ?Postop Assessment: no apparent nausea or vomiting ?Anesthetic complications: no ? ? ?No notable events documented. ? ? ?Last Vitals:  ?Vitals:  ? 05/05/21 0957 05/05/21 0959  ?BP: (!) 151/74 129/72  ?Pulse: (!) 52 60  ?Resp: 14 18  ?Temp: 36.6 ?C   ?SpO2: 100% 100%  ?  ?Last Pain:  ?Vitals:  ? 05/05/21 0959  ?TempSrc:   ?PainSc: 0-No pain  ? ? ?  ?  ?  ?  ?  ?  ? ?Brytni Dray C Korben Carcione ? ? ? ? ?

## 2021-05-05 NOTE — Anesthesia Preprocedure Evaluation (Addendum)
Anesthesia Evaluation  ?Patient identified by MRN, date of birth, ID band ?Patient awake ? ? ? ?Reviewed: ?Allergy & Precautions, NPO status , Patient's Chart, lab work & pertinent test results, reviewed documented beta blocker date and time  ? ?History of Anesthesia Complications ?Negative for: history of anesthetic complications ? ?Airway ?Mallampati: II ? ?TM Distance: >3 FB ?Neck ROM: Full ? ? ? Dental ? ?(+) Dental Advisory Given, Teeth Intact ?  ?Pulmonary ?neg pulmonary ROS,  ?  ?Pulmonary exam normal ?breath sounds clear to auscultation ? ? ? ? ? ? Cardiovascular ?Pt. on home beta blockers ?Normal cardiovascular exam ?Rhythm:Regular Rate:Normal ? ? ?  ?Neuro/Psych ?PSYCHIATRIC DISORDERS Depression  Neuromuscular disease (tremors)   ? GI/Hepatic ?negative GI ROS, Neg liver ROS,   ?Endo/Other  ?negative endocrine ROS ? Renal/GU ?Renal disease  ?negative genitourinary ?  ?Musculoskeletal ?negative musculoskeletal ROS ?(+)  ? Abdominal ?  ?Peds ?negative pediatric ROS ?(+)  Hematology ?negative hematology ROS ?(+)   ?Anesthesia Other Findings ?Takes propranolol for tremors ? Reproductive/Obstetrics ?negative OB ROS ? ?  ? ? ? ? ? ? ? ? ? ? ? ? ? ?  ?  ? ? ? ? ? ? ? ?Anesthesia Physical ?Anesthesia Plan ? ?ASA: 2 ? ?Anesthesia Plan: General  ? ?Post-op Pain Management: Minimal or no pain anticipated  ? ?Induction: Intravenous ? ?PONV Risk Score and Plan: Treatment may vary due to age or medical condition ? ?Airway Management Planned: Nasal Cannula and Natural Airway ? ?Additional Equipment:  ? ?Intra-op Plan:  ? ?Post-operative Plan:  ? ?Informed Consent: I have reviewed the patients History and Physical, chart, labs and discussed the procedure including the risks, benefits and alternatives for the proposed anesthesia with the patient or authorized representative who has indicated his/her understanding and acceptance.  ? ? ? ?Dental advisory given ? ?Plan Discussed with: CRNA and  Surgeon ? ?Anesthesia Plan Comments:   ? ? ? ? ? ? ?Anesthesia Quick Evaluation ? ?

## 2021-05-05 NOTE — Transfer of Care (Signed)
Immediate Anesthesia Transfer of Care Note ? ?Patient: Raven Golden ? ?Procedure(s) Performed: ESOPHAGOGASTRODUODENOSCOPY (EGD) WITH PROPOFOL ?ESOPHAGEAL DILATION ? ?Patient Location: PACU ? ?Anesthesia Type:General ? ?Level of Consciousness: awake, alert  and oriented ? ?Airway & Oxygen Therapy: Patient Spontanous Breathing ? ?Post-op Assessment: Report given to RN, Post -op Vital signs reviewed and stable, Patient moving all extremities X 4 and Patient able to stick tongue midline ? ?Post vital signs: Reviewed ? ?Last Vitals:  ?Vitals Value Taken Time  ?BP 151/74   ?Temp 97.7   ?Pulse 51   ?Resp 19   ?SpO2 99   ? ? ?Last Pain:  ?Vitals:  ? 05/05/21 0824  ?TempSrc: Oral  ?PainSc: 0-No pain  ?   ? ?Patients Stated Pain Goal: 8 (05/05/21 3810) ? ?Complications: No notable events documented. ?

## 2021-05-05 NOTE — Op Note (Addendum)
[facility] ?Patient Name: Raven Golden ?Procedure Date: 05/05/2021 9:26 AM ?MRN: [id number] ?Date of Birth: February 04, 1955 ?Attending MD: Maylon Peppers ,  ?CSN: [id number] ?Age: 66 ?Admit Type: Outpatient ?Procedure:                Upper GI endoscopy ?Indications:              Dysphagia ?Providers:                Maylon Peppers, Lurline Del, RN, Aram Candela ?Referring MD:              ?Medicines:                Monitored Anesthesia Care ?Complications:            No immediate complications. ?Estimated Blood Loss:     Estimated blood loss: none. ?Procedure:                Pre-Anesthesia Assessment: ?                          - Prior to the procedure, a History and Physical  ?                          was performed, and patient medications, allergies  ?                          and sensitivities were reviewed. The patient's  ?                          tolerance of previous anesthesia was reviewed. ?                          - The risks and benefits of the procedure and the  ?                          sedation options and risks were discussed with the  ?                          patient. All questions were answered and informed  ?                          consent was obtained. ?                          - ASA Grade Assessment: II - A patient with mild  ?                          systemic disease. ?                          After obtaining informed consent, the endoscope was  ?                          passed under direct vision. Throughout the  ?                          procedure, the patient's blood pressure, pulse, and  ?  oxygen saturations were monitored continuously. The  ?                          GIF-H190 ([id number]) scope was introduced through the  ?                          mouth, and advanced to the second part of duodenum.  ?                          The upper GI endoscopy was accomplished without  ?                          difficulty. The patient tolerated the procedure  ?                           well. ?Scope In: 9:42:16 AM ?Scope Out: 9:52:25 AM ?Total Procedure Duration: 0 hours 10 minutes 9 seconds  ?Findings: ?     No endoscopic abnormality was evident in the esophagus to explain the  ?     patient's complaint of dysphagia. It was decided, however, to proceed  ?     with dilation of the entire esophagus. A guidewire was placed and the  ?     scope was withdrawn. Dilation was performed with a Savary dilator with  ?     no resistance at 18 mm. No mucosal disruption was seen upon  ?     reinspection. Biopsies were obtained from the proximal and distal  ?     esophagus with cold forceps for histology of suspected eosinophilic  ?     esophagitis. ?     Localized mild inflammation characterized by erythema was found in the  ?     gastric antrum. Biopsies were taken with a cold forceps for Helicobacter  ?     pylori testing. ?     Localized mild inflammation characterized by erythema was found in the  ?     duodenal bulb. ?Impression:               - No endoscopic esophageal abnormality to explain  ?                          patient's dysphagia. Esophagus dilated. Dilated.  ?                          Biopsied. ?                          - Gastritis. Biopsied. ?                          - Duodenitis. ?Moderate Sedation: ?     Per Anesthesia Care ?Recommendation:           - Discharge patient to home (ambulatory). ?                          - Resume previous diet. ?                          - Await pathology  results. ?                          - If negative biopsies and persistent dysphagia,  ?                          consider evaluation with MBS and ultimately  ?                          esophageal manometry. ?Procedure Code(s):        --- Professional --- ?                          [id number], Esophagogastroduodenoscopy, flexible,  ?                          transoral; with insertion of guide wire followed by  ?                          passage of dilator(s) through esophagus over guide  ?                           wire ?                          43239, 59, Esophagogastroduodenoscopy, flexible,  ?                          transoral; with biopsy, single or multiple ?Diagnosis Code(s):        --- Professional --- ?                          R13.10, Dysphagia, unspecified ?                          K29.70, Gastritis, unspecified, without bleeding ?                          K29.80, Duodenitis without bleeding ?CPT copyright 2019 American Medical Association. All rights reserved. ?The codes documented in this report are preliminary and upon coder review may  ?be revised to meet current compliance requirements. ?Maylon Peppers, MD ?Maylon Peppers,  ?05/05/2021 10:02:19 AM ?This report has been signed electronically. ?Number of Addenda: 0 ?

## 2021-05-06 LAB — SURGICAL PATHOLOGY

## 2021-05-10 ENCOUNTER — Encounter (HOSPITAL_COMMUNITY): Payer: Self-pay | Admitting: Gastroenterology

## 2021-05-25 ENCOUNTER — Other Ambulatory Visit: Payer: Self-pay | Admitting: Neurology

## 2021-09-02 ENCOUNTER — Other Ambulatory Visit: Payer: Self-pay | Admitting: Internal Medicine

## 2021-09-02 DIAGNOSIS — Z1231 Encounter for screening mammogram for malignant neoplasm of breast: Secondary | ICD-10-CM

## 2021-09-03 ENCOUNTER — Ambulatory Visit: Payer: BC Managed Care – PPO | Admitting: Neurology

## 2021-09-03 DIAGNOSIS — G243 Spasmodic torticollis: Secondary | ICD-10-CM | POA: Diagnosis not present

## 2021-09-03 MED ORDER — INCOBOTULINUMTOXINA 100 UNITS IM SOLR
300.0000 [IU] | INTRAMUSCULAR | Status: AC
  Administered 2021-09-03: 300 [IU] via INTRAMUSCULAR

## 2021-09-03 NOTE — Addendum Note (Signed)
Addended by: Vladimir Faster on: 09/03/2021 01:58 PM   Modules accepted: Orders

## 2021-09-03 NOTE — Procedures (Signed)
[facility]   Procedure Note Botox  Attending: Dr. Lurena Joiner Raven Golden  Preoperative Diagnosis(es): Cervical Dystonia  Results :  doing well, but wearing off early   Consent obtained from: The patient Benefits discussed included, but were not limited to decreased muscle tightness, increased joint range of motion, and decreased pain.  Risk discussed included, but were not limited pain and discomfort, bleeding, bruising, excessive weakness, venous thrombosis, muscle atrophy and dysphagia.  A copy of the patient medication guide was given to the patient which explains the blackbox warning.  Patients identity and treatment sites confirmed Yes.  .  Details of Procedure: Skin was cleaned with alcohol.  A 30 gauge, 22mm  needle was introduced to the target muscle, except for posterior splenius where 27 gauge, 1.5 inch needle used.   Prior to injection, the needle plunger was aspirated to make sure the needle was not within a blood vessel.  There was no blood retrieved on aspiration.    Following is a summary of the muscles injected  And the amount of Botulinum toxin used:   Dilution 0.9% preservative free saline mixed with 100 u Botox type A to make 10 U per 0.1cc  Injections  Location Left  Right Units Number of sites        Sternocleidomastoid 60/20  80 2  Splenius Capitus, posterior approach  100 100 1  Splenius Capitus, lateral approach  60 60 1  Levator Scapulae      Trapezius 20 20/20 60 3 total        TOTAL UNITS:   300    Agent: Botulinum toxin type A (Xeomin).  3 vials of Botox were used, each containing 100 units and freshly diluted with 1 mL of sterile, non-preserved saline   Total injected (Units): 300  Total wasted (Units): 0   Pt tolerated procedure well without complications.   Reinjection is anticipated in 4 months.  Clinical comment: Patient asked about the wearing off of the medication and what we can do.  We discussed increasing Xeomin versus changing to  onobotulinum toxin.  She stated that she will be going to different insurance (Medicare) next year and wanted to hold off on any changes until then.

## 2021-09-24 ENCOUNTER — Ambulatory Visit: Payer: BC Managed Care – PPO

## 2021-10-20 ENCOUNTER — Ambulatory Visit
Admission: RE | Admit: 2021-10-20 | Discharge: 2021-10-20 | Disposition: A | Discharge status: Home / Self Care | Payer: BC Managed Care – PPO | Source: Ambulatory Visit | Attending: Internal Medicine | Admitting: Internal Medicine

## 2021-10-20 DIAGNOSIS — Z1231 Encounter for screening mammogram for malignant neoplasm of breast: Secondary | ICD-10-CM

## 2021-11-03 ENCOUNTER — Telehealth: Payer: Self-pay | Admitting: Neurology

## 2021-11-03 NOTE — Telephone Encounter (Signed)
Raven Golden called and states that she is having tremors bad and wanted to see if Dr Tat wanted up the dosage of the Xeomin  as they have spoke about in the past.

## 2021-11-04 NOTE — Telephone Encounter (Signed)
Called patient & left voice mail.

## 2021-11-12 NOTE — Telephone Encounter (Signed)
Spoke to patient and made her aware that I have sent this to the PA team to determine if an additional PA is needed to order her the extra 50 units of Xeomin

## 2021-11-16 ENCOUNTER — Other Ambulatory Visit (HOSPITAL_COMMUNITY): Payer: Self-pay

## 2021-11-18 ENCOUNTER — Telehealth: Payer: Self-pay

## 2021-11-18 NOTE — Telephone Encounter (Signed)
MD would like to increase Xeomin dose from 300 to 350 units.  Submitted a FAXED Prior Authorization request to [facility] for  XEOMIN 400  via CoverMyMeds. Will update once we receive a response.   Key: [id number]

## 2021-11-19 ENCOUNTER — Telehealth: Payer: Self-pay | Admitting: Anesthesiology

## 2021-11-19 NOTE — Telephone Encounter (Signed)
Kim R from [facility] left message after hours requesting information about this patient's prescription. Did not say which medication.

## 2021-11-19 NOTE — Telephone Encounter (Signed)
Spoke to [facility] and cleared up new dosage and to get 50 units delivered before patients appointment

## 2021-11-23 ENCOUNTER — Other Ambulatory Visit (HOSPITAL_COMMUNITY): Payer: Self-pay

## 2021-11-24 NOTE — Telephone Encounter (Signed)
Received fax from [facility] stating that a new prior authorization is NOT required for dose change. Request was submitted for 400 total units, however you can attempt to send in an Rx with 1 fill to acquire the additional 50 units needed. Otherwise the change in dose may need to happen on the pt's NEXT administration.  Response letter and PA request form sent to scan center for retention.

## 2022-01-14 ENCOUNTER — Ambulatory Visit: Payer: BC Managed Care – PPO | Admitting: Neurology

## 2022-01-14 DIAGNOSIS — G243 Spasmodic torticollis: Secondary | ICD-10-CM | POA: Diagnosis not present

## 2022-01-14 MED ORDER — INCOBOTULINUMTOXINA 50 UNITS IM SOLR
50.0000 [IU] | INTRAMUSCULAR | Status: AC
  Administered 2022-01-14: 30 [IU] via INTRAMUSCULAR

## 2022-01-14 MED ORDER — INCOBOTULINUMTOXINA 100 UNITS IM SOLR
300.0000 [IU] | Freq: Once | INTRAMUSCULAR | Status: AC
  Administered 2022-01-14: 300 [IU] via INTRAMUSCULAR

## 2022-01-14 NOTE — Procedures (Signed)
[facility]   Procedure Note Botox  Attending: Dr. Wells Guiles Raven Golden  Preoperative Diagnosis(es): Cervical Dystonia  Results :  doing well, but wearing off early   Consent obtained from: The patient Benefits discussed included, but were not limited to decreased muscle tightness, increased joint range of motion, and decreased pain.  Risk discussed included, but were not limited pain and discomfort, bleeding, bruising, excessive weakness, venous thrombosis, muscle atrophy and dysphagia.  A copy of the patient medication guide was given to the patient which explains the blackbox warning.  Patients identity and treatment sites confirmed Yes.  .  Details of Procedure: Skin was cleaned with alcohol.  A 30 gauge, 56mm  needle was introduced to the target muscle, except for posterior splenius where 27 gauge, 1.5 inch needle used.   Prior to injection, the needle plunger was aspirated to make sure the needle was not within a blood vessel.  There was no blood retrieved on aspiration.    Following is a summary of the muscles injected  And the amount of Botulinum toxin used:   Dilution 0.9% preservative free saline mixed with 100 u Botox type A to make 10 U per 0.1cc  Injections  Location Left  Right Units Number of sites        Sternocleidomastoid 60/30  90 2  Splenius Capitus, posterior approach  100 100 1  Splenius Capitus, lateral approach  60 60 1  Semispinalis capitus 20  20   Trapezius 20 20/20 60 3 total        TOTAL UNITS:   330    Agent: Botulinum toxin type A (Xeomin).  3 vials of xeomin were used, each containing 100 units and freshly diluted with 1 mL of sterile, non-preserved saline, 1 vial of 50 u was used, containing 50 U freshly diluted with 0.5 ml of sterile non preserved saline   Total injected (Units): 330  Total wasted (Units):20   Pt tolerated procedure well without complications.   Reinjection is anticipated in 4 months.  Clinical comment: Patient states that  she is going to Medicare next month and that they have told her that they will not pay for Xeomin, but would for onobotulinum toxin.  She is to call next month for new authorization so that we can switch.

## 2022-03-30 ENCOUNTER — Telehealth: Payer: Self-pay | Admitting: Anesthesiology

## 2022-03-30 NOTE — Telephone Encounter (Signed)
Pt called stating she has new insurance [employer]. States her Botox need a PA.

## 2022-03-30 NOTE — Telephone Encounter (Signed)
Sent to PA team

## 2022-04-18 ENCOUNTER — Telehealth: Payer: Self-pay | Admitting: Pharmacy Technician

## 2022-04-18 ENCOUNTER — Telehealth: Payer: Self-pay | Admitting: Neurology

## 2022-04-18 ENCOUNTER — Other Ambulatory Visit (HOSPITAL_COMMUNITY): Payer: Self-pay

## 2022-04-18 NOTE — Telephone Encounter (Signed)
Sent this to [place] in PA team to check on this for me

## 2022-04-18 NOTE — Telephone Encounter (Signed)
Patient state that her insurance [employer] has nto heard anythign from [place] about the Prior auth for the [place]. She states that she was told that [facility] needed a prior auth and usually does not cover Xeomine but will cover Botox. Next appt for injection is 05-27-22  She called a several weeks ago about this and has not heard back from any one so she was just checking on this

## 2022-04-18 NOTE — Telephone Encounter (Signed)
Patient Advocate Encounter  Received notification from [facility] that prior authorization for XEOMIN 200U is required.   PA submitted on 4.8.24 as [employer]/Bill Key [id number] Status is pending

## 2022-04-18 NOTE — Telephone Encounter (Signed)
Called pateint and left voicemail  

## 2022-04-19 NOTE — Telephone Encounter (Signed)
Called patient and let her know that the PA team is working on it now

## 2022-04-20 ENCOUNTER — Other Ambulatory Visit (HOSPITAL_COMMUNITY): Payer: Self-pay

## 2022-04-20 NOTE — Telephone Encounter (Signed)
Pharmacy Patient Advocate Encounter- Medical Benefit:  J code: [id number] CPT code: 89169 Dx Code: G24.3  PA was submitted to [facility] and has been approved through: 4.8.24 - 12.31.24 Authorization# [id number]

## 2022-04-20 NOTE — Telephone Encounter (Signed)
ERROR

## 2022-05-20 ENCOUNTER — Ambulatory Visit: Payer: BC Managed Care – PPO | Admitting: Neurology

## 2022-05-27 ENCOUNTER — Ambulatory Visit: Payer: Medicare PPO | Admitting: Neurology

## 2022-05-27 DIAGNOSIS — G243 Spasmodic torticollis: Secondary | ICD-10-CM | POA: Diagnosis not present

## 2022-05-27 MED ORDER — INCOBOTULINUMTOXINA 50 UNITS IM SOLR
50.0000 [IU] | INTRAMUSCULAR | Status: AC
  Administered 2022-05-27: 30 [IU] via INTRAMUSCULAR

## 2022-05-27 MED ORDER — BACLOFEN 10 MG PO TABS: 10.0000 mg | ORAL_TABLET | Freq: Two times a day (BID) | ORAL | 1 refills | Status: DC | PRN

## 2022-05-27 MED ORDER — INCOBOTULINUMTOXINA 100 UNITS IM SOLR
300.0000 [IU] | INTRAMUSCULAR | Status: AC
  Administered 2022-05-27: 300 [IU] via INTRAMUSCULAR

## 2022-05-27 NOTE — Procedures (Signed)
[facility]   Procedure Note Botox  Attending: Dr. Lurena Joiner Braeson Rupe  Preoperative Diagnosis(es): Cervical Dystonia  Results :  doing well, but wearing off early.  Asks me if she can try baclofen.  I told her I will be happy to give her a prescription, but not sure that it would be helpful.  She has tried clonazepam in the past, but it caused cognitive dulling.   Consent obtained from: The patient Benefits discussed included, but were not limited to decreased muscle tightness, increased joint range of motion, and decreased pain.  Risk discussed included, but were not limited pain and discomfort, bleeding, bruising, excessive weakness, venous thrombosis, muscle atrophy and dysphagia.  A copy of the patient medication guide was given to the patient which explains the blackbox warning.  Patients identity and treatment sites confirmed Yes.  .  Details of Procedure: Skin was cleaned with alcohol.  A 30 gauge, 25mm  needle was introduced to the target muscle, except for posterior splenius where 27 gauge, 1.5 inch needle used.   Prior to injection, the needle plunger was aspirated to make sure the needle was not within a blood vessel.  There was no blood retrieved on aspiration.    Following is a summary of the muscles injected  And the amount of Botulinum toxin used:   Dilution 0.9% preservative free saline mixed with 100 u Botox type A to make 10 U per 0.1cc  Injections  Location Left  Right Units Number of sites        Sternocleidomastoid 60/30  90 2  Splenius Capitus, posterior approach  100 100 1  Splenius Capitus, lateral approach  60 60 1  Semispinalis capitus 20  20   Trapezius 20 20/20 60 3 total        TOTAL UNITS:   330    Agent: Botulinum toxin type A (Xeomin).  3 vials of xeomin were used, each containing 100 units and freshly diluted with 1 mL of sterile, non-preserved saline, 1 vial of 50 u was used, containing 50 U freshly diluted with 0.5 ml of sterile non preserved  saline   Total injected (Units): 330  Total wasted (Units):20   Pt tolerated procedure well without complications.   Reinjection is anticipated in 4 months.

## 2022-07-28 DIAGNOSIS — E785 Hyperlipidemia, unspecified: Secondary | ICD-10-CM | POA: Diagnosis not present

## 2022-09-07 DIAGNOSIS — Z299 Encounter for prophylactic measures, unspecified: Secondary | ICD-10-CM | POA: Diagnosis not present

## 2022-09-07 DIAGNOSIS — R5383 Other fatigue: Secondary | ICD-10-CM | POA: Diagnosis not present

## 2022-09-07 DIAGNOSIS — R110 Nausea: Secondary | ICD-10-CM | POA: Diagnosis not present

## 2022-09-28 ENCOUNTER — Telehealth: Payer: Self-pay | Admitting: Neurology

## 2022-09-28 NOTE — Telephone Encounter (Signed)
Patient called nurse line during lunch hours, stating that she is having heaviness in her neck and the tremors have gotten worse from her head. Patient is wanting to know if Dr. Arbutus Leas could go up in the dosage with the Xeomin

## 2022-09-29 ENCOUNTER — Other Ambulatory Visit: Payer: Self-pay | Admitting: Internal Medicine

## 2022-09-29 DIAGNOSIS — Z1231 Encounter for screening mammogram for malignant neoplasm of breast: Secondary | ICD-10-CM

## 2022-09-30 ENCOUNTER — Other Ambulatory Visit (INDEPENDENT_AMBULATORY_CARE_PROVIDER_SITE_OTHER): Payer: Medicare PPO

## 2022-09-30 ENCOUNTER — Ambulatory Visit: Payer: Medicare PPO | Admitting: Neurology

## 2022-09-30 DIAGNOSIS — R29898 Other symptoms and signs involving the musculoskeletal system: Secondary | ICD-10-CM

## 2022-09-30 DIAGNOSIS — G243 Spasmodic torticollis: Secondary | ICD-10-CM

## 2022-09-30 MED ORDER — INCOBOTULINUMTOXINA 100 UNITS IM SOLR
400.0000 [IU] | INTRAMUSCULAR | Status: AC
  Administered 2022-09-30: 330 [IU] via INTRAMUSCULAR

## 2022-09-30 NOTE — Procedures (Signed)
[facility]   Procedure Note Botox  Attending: Dr. Lurena Joiner Yomayra Tate  Preoperative Diagnosis(es): Cervical Dystonia  Results : She is noting that her head feels heavy and has trouble keeping it up, but it is intermittent, and is actually getting worse the longer Botox is wearing off and not getting better.  She has no swallow trouble.  No ptosis.  No diplopia.  We decided to go ahead and check AchR Ab to make sure we are not missing anything else, since we would expect that head heaviness from Botox would be most prominent not long after injections and would not really be intermittent.  She is also noticing it more at the end of the day.  She is not sure that Xeomin has been all that effective and we are going to appeal to change to Daxxify or perhaps onobotulinum toxin, which her insurance previously declined.   Consent obtained from: The patient Benefits discussed included, but were not limited to decreased muscle tightness, increased joint range of motion, and decreased pain.  Risk discussed included, but were not limited pain and discomfort, bleeding, bruising, excessive weakness, venous thrombosis, muscle atrophy and dysphagia.  A copy of the patient medication guide was given to the patient which explains the blackbox warning.  Patients identity and treatment sites confirmed Yes.  .  Details of Procedure: Skin was cleaned with alcohol.  A 30 gauge, 25mm  needle was introduced to the target muscle, except for posterior splenius where 27 gauge, 1.5 inch needle used.   Prior to injection, the needle plunger was aspirated to make sure the needle was not within a blood vessel.  There was no blood retrieved on aspiration.    Following is a summary of the muscles injected  And the amount of ankle July Naproxen-Na used:   Dilution 0.9% preservative free saline mixed with 100 u incobotulinumtoxin  to make 10 U per 0.1cc  Injections  Location Left  Right Units Number of sites         Sternocleidomastoid 60/30  90 2  Splenius Capitus, posterior approach  100 100 1  Splenius Capitus, lateral approach  60 60 1  Semispinalis capitus 20  20   Trapezius 20 20/20 60 3 total        TOTAL UNITS:   330    Agent: Botulinum toxin type A (Xeomin).  4 vials of xeomin were used, each containing 100 units and freshly diluted with 1 mL of sterile, non-preserved saline,    Total injected (Units): 330  Total wasted (Units):70   Pt tolerated procedure well without complications.   Reinjection is anticipated in 4 months.

## 2022-10-05 ENCOUNTER — Other Ambulatory Visit (HOSPITAL_COMMUNITY): Payer: Self-pay

## 2022-10-07 LAB — MYASTHENIA GRAVIS PANEL 1
A CHR BINDING ABS: <0.3 nmol/L
STRIATED MUSCLE AB SCREEN: NEGATIVE

## 2022-10-11 ENCOUNTER — Telehealth: Payer: Self-pay | Admitting: Pharmacy Technician

## 2022-10-11 ENCOUNTER — Other Ambulatory Visit (HOSPITAL_COMMUNITY): Payer: Self-pay

## 2022-10-11 NOTE — Telephone Encounter (Signed)
BotoxOne verification has been submitted. Benefit Verification:  [id number] (completed 9.26.24)  Pharmacy/Medical PA has been submitted for BOTOX 100x4 via CMM. INSURANCE: [employer] DATE SUBMITTED: 10.1.24 KEY: [id number] Status is pending

## 2022-10-11 NOTE — Telephone Encounter (Signed)
Pharmacy Patient Advocate Encounter- Botox BIV-Medical Benefit:  Buy/Bill J code: [id number]   PA was submitted to [facility] and has been approved through: 10.1.24 TO 12.31.25 Authorization# [id number]  Please send prescription to Specialty Pharmacy: [facility]: [phone] Estimated Patient cost is: -  Patient IS NOT eligible for Botox Copay Card. Copay Card can make patient's cost as little as zero. Copay card will be provided to pharmacy.

## 2022-10-24 ENCOUNTER — Ambulatory Visit
Admission: RE | Admit: 2022-10-24 | Discharge: 2022-10-24 | Disposition: A | Discharge status: Home / Self Care | Payer: Medicare PPO | Source: Ambulatory Visit | Attending: Internal Medicine | Admitting: Internal Medicine

## 2022-10-24 DIAGNOSIS — Z1231 Encounter for screening mammogram for malignant neoplasm of breast: Secondary | ICD-10-CM

## 2022-11-07 ENCOUNTER — Other Ambulatory Visit: Payer: Self-pay | Admitting: Neurology

## 2022-11-07 DIAGNOSIS — G243 Spasmodic torticollis: Secondary | ICD-10-CM

## 2022-11-07 NOTE — Telephone Encounter (Signed)
Called patient she is still taking Baclofen and it is helping her sent in new RX. Pateint said so far since procedure on 09/30/22 she is feeling pretty good

## 2022-12-12 DIAGNOSIS — H2513 Age-related nuclear cataract, bilateral: Secondary | ICD-10-CM | POA: Diagnosis not present

## 2023-01-16 DIAGNOSIS — H01001 Unspecified blepharitis right upper eyelid: Secondary | ICD-10-CM | POA: Diagnosis not present

## 2023-01-16 DIAGNOSIS — H25813 Combined forms of age-related cataract, bilateral: Secondary | ICD-10-CM | POA: Diagnosis not present

## 2023-01-16 DIAGNOSIS — H01002 Unspecified blepharitis right lower eyelid: Secondary | ICD-10-CM | POA: Diagnosis not present

## 2023-01-16 DIAGNOSIS — H01004 Unspecified blepharitis left upper eyelid: Secondary | ICD-10-CM | POA: Diagnosis not present

## 2023-01-27 ENCOUNTER — Other Ambulatory Visit: Payer: Self-pay | Admitting: Neurology

## 2023-01-27 DIAGNOSIS — G243 Spasmodic torticollis: Secondary | ICD-10-CM

## 2023-01-27 NOTE — Telephone Encounter (Signed)
Refilled Baclofen and we  refilled it in October and pateitn was still taking it and helping. Approval to send in RX

## 2023-02-02 DIAGNOSIS — H25812 Combined forms of age-related cataract, left eye: Secondary | ICD-10-CM | POA: Diagnosis not present

## 2023-02-03 ENCOUNTER — Ambulatory Visit: Payer: Medicare PPO | Admitting: Neurology

## 2023-02-03 DIAGNOSIS — G243 Spasmodic torticollis: Secondary | ICD-10-CM | POA: Diagnosis not present

## 2023-02-03 MED ORDER — BOTOX 100 UNITS IJ SOLR: INTRAMUSCULAR | Status: AC

## 2023-02-03 MED ORDER — ONABOTULINUMTOXINA 200 UNITS IJ SOLR
200.0000 [IU] | Freq: Once | INTRAMUSCULAR | Status: AC
  Administered 2023-02-03: 200 [IU] via INTRAMUSCULAR

## 2023-02-03 MED ORDER — ONABOTULINUMTOXINA 100 UNITS IJ SOLR
100.0000 [IU] | Freq: Once | INTRAMUSCULAR | Status: AC
Start: 2023-02-03 — End: 2023-02-03
  Administered 2023-02-03: 100 [IU] via INTRAMUSCULAR

## 2023-02-03 MED ORDER — BOTOX 200 UNITS IJ SOLR: INTRAMUSCULAR | Status: AC

## 2023-02-03 NOTE — Procedures (Addendum)
 [facility]   Procedure Note Botox   Attending: Dr. Ivette Marks Ehtan Delfavero  Preoperative Diagnosis(es): Cervical Dystonia  Results : didn't last as long.  Changing to onabotulinum toxin A   Consent obtained from: The patient Benefits discussed included, but were not limited to decreased muscle tightness, increased joint range of motion, and decreased pain.  Risk discussed included, but were not limited pain and discomfort, bleeding, bruising, excessive weakness, venous thrombosis, muscle atrophy and dysphagia.  A copy of the patient medication guide was given to the patient which explains the blackbox warning.  Patients identity and treatment sites confirmed Yes.  .  Details of Procedure: Skin was cleaned with alcohol.  A 30 gauge, 25mm  needle was introduced to the target muscle, except for posterior splenius where 27 gauge, 1.5 inch needle used.   Prior to injection, the needle plunger was aspirated to make sure the needle was not within a blood vessel.  There was no blood retrieved on aspiration.    Following is a summary of the muscles injected and the amount of onabotulinum toxin A   Dilution 0.9% preservative free saline mixed with 100 u onabotulinum toxin A 10 U per 0.1cc  Injections  Location Left  Right Units Number of sites        Sternocleidomastoid 60/30  90 2  Splenius Capitus, posterior approach  100 100 1  Splenius Capitus, lateral approach  60 60 1  Semispinalis capitus 20  20   Trapezius 10 20 30 3  total        TOTAL UNITS:   300    Agent: Onobotulinum toxin type A.  2 vials of onobotulinum toxin a were used (one 200 unit, 1 of 100 units) each freshly diluted with 1 mL of sterile, non-preserved saline to make 10u/cc.   Total injected (Units): 300  Total wasted (Units):0   Pt tolerated procedure well without complications.   Reinjection is anticipated in 4 months.

## 2023-02-08 ENCOUNTER — Encounter (HOSPITAL_COMMUNITY)
Admission: RE | Admit: 2023-02-08 | Discharge: 2023-02-08 | Disposition: A | Discharge status: Home / Self Care | Payer: Medicare PPO | Source: Ambulatory Visit | Attending: Ophthalmology | Admitting: Ophthalmology

## 2023-02-08 ENCOUNTER — Encounter (HOSPITAL_COMMUNITY): Payer: Self-pay

## 2023-02-08 ENCOUNTER — Other Ambulatory Visit: Payer: Self-pay

## 2023-02-09 NOTE — H&P (Signed)
Surgical History & Physical  Patient Name: Raven Golden  DOB: 01-18-1955  Surgery: Cataract extraction with intraocular lens implant phacoemulsification; Left Eye Surgeon: [name] MD Surgery Date: 02/10/2023 Pre-Op Date: 01/16/2023  HPI: A 34 Yr. old female patient 1. The patient is present for Cataract Evaluation . The patient complains of difficulty when seeing street signs, which began 2 years ago. Both eyes are affected. The episode is constant. The patient describes hazy symptoms affecting their eyes/vision. Distance vision is worse than near. This is negatively affecting the patient's quality of life and the patient is unable to function adequately in life with the current level of vision. HPI was performed by [name] .  Medical History: Cataracts  Hypercholesterolemia, Anxiety disorder, Tremors  Review of Systems Negative Allergic/Immunologic Negative Cardiovascular Negative Constitutional Negative Ear, Nose, Mouth & Throat Negative Endocrine Negative Eyes Negative Gastrointestinal Negative Genitourinary Negative Hemotologic/Lymphatic Negative Integumentary Negative Musculoskeletal Tremors Neurological Anxiety Psychiatry Negative Respiratory  Social Never smoked  Medication primidone ,  doxepin ,  propranolol ,  escitalopram oxalate ,  baclofen ,  estradiol ,  rosuvastatin   Sx/Procedures Hysterectomy, Uterine leiomyome  Drug Allergies  NKDA  History & Physical: Heent: cataracts NECK: supple without bruits LUNGS: lungs clear to auscultation CV: regular rate and rhythm Abdomen: soft and non-tender  Impression & Plan: Assessment: 1.  COMBINED FORMS AGE RELATED CATARACT; Both Eyes (H25.813) 2.  BLEPHARITIS; Right Upper Lid, Right Lower Lid, Left Upper Lid, Left Lower Lid (H01.001, H01.002,H01.004,H01.005) 3.  DERMATOCHALASIS, no surgery; Right Upper Lid, Left Upper Lid (H02.831, H02.834) 4.  Pinguecula; Both Eyes (H11.153)  Plan: 1.  Cataract  accounts for the patient's decreased vision. This visual impairment is not correctable with a tolerable change in glasses or contact lenses. Cataract surgery with an implantation of a new lens should significantly improve the visual and functional status of the patient. Discussed all risks, benefits, alternatives, and potential complications. Discussed the procedures and recovery. Patient desires to have surgery. A-scan ordered and performed today for intra-ocular lens calculations. The surgery will be performed in order to improve vision for driving, reading, and for eye examinations. Recommend phacoemulsification with intra-ocular lens. Recommend Dextenza for post-operative pain and inflammation. Left Eye worse - first. Dilates poorly - shugarcaine by protocol. Malyugin Ring. Omidira. Vivity Lens.  2.  Blepharitis is present - recommend regular lid cleaning.  3.  Asymptomatic, recommend observation for now. Findings, prognosis and treatment options reviewed.  4.  Observe; Artificial tears as needed for irritation.

## 2023-02-10 ENCOUNTER — Ambulatory Visit (HOSPITAL_COMMUNITY)
Admission: RE | Admit: 2023-02-10 | Discharge: 2023-02-10 | Disposition: A | Discharge status: Home / Self Care | Payer: Medicare PPO | Attending: Ophthalmology | Admitting: Ophthalmology

## 2023-02-10 ENCOUNTER — Ambulatory Visit (HOSPITAL_COMMUNITY): Payer: Medicare PPO | Admitting: Anesthesiology

## 2023-02-10 ENCOUNTER — Encounter (HOSPITAL_COMMUNITY)
Admission: RE | Disposition: A | Discharge status: Home / Self Care | Payer: Self-pay | Source: Home / Self Care | Attending: Ophthalmology

## 2023-02-10 DIAGNOSIS — I10 Essential (primary) hypertension: Secondary | ICD-10-CM | POA: Insufficient documentation

## 2023-02-10 DIAGNOSIS — H25812 Combined forms of age-related cataract, left eye: Secondary | ICD-10-CM

## 2023-02-10 HISTORY — PX: CATARACT EXTRACTION W/PHACO: SHX586

## 2023-02-10 SURGERY — PHACOEMULSIFICATION, CATARACT, WITH IOL INSERTION
Anesthesia: General | Site: Eye | Laterality: Left

## 2023-02-10 MED ORDER — TROPICAMIDE 1 % OP SOLN
1.0000 [drp] | OPHTHALMIC | Status: AC | PRN
  Administered 2023-02-10 (×3): 1 [drp] via OPHTHALMIC

## 2023-02-10 MED ORDER — MIDAZOLAM HCL 2 MG/2ML IJ SOLN
INTRAMUSCULAR | Status: AC
Start: 2023-02-10 — End: 2023-02-10
  Filled 2023-02-10: qty 2

## 2023-02-10 MED ORDER — SODIUM CHLORIDE 0.9% FLUSH
INTRAVENOUS | Status: DC | PRN
  Administered 2023-02-10: 10 mL via INTRAVENOUS

## 2023-02-10 MED ORDER — SODIUM HYALURONATE 23MG/ML IO SOSY
PREFILLED_SYRINGE | INTRAOCULAR | Status: DC | PRN
  Administered 2023-02-10: .6 mL via INTRAOCULAR

## 2023-02-10 MED ORDER — EPINEPHRINE PF 1 MG/ML IJ SOLN
INTRAOCULAR | Status: DC | PRN
  Administered 2023-02-10: 500 mL

## 2023-02-10 MED ORDER — LIDOCAINE HCL (PF) 1 % IJ SOLN
INTRAOCULAR | Status: DC | PRN
  Administered 2023-02-10: 1 mL via OPHTHALMIC

## 2023-02-10 MED ORDER — FENTANYL CITRATE (PF) 100 MCG/2ML IJ SOLN
INTRAMUSCULAR | Status: AC
  Filled 2023-02-10: qty 2

## 2023-02-10 MED ORDER — STERILE WATER FOR IRRIGATION IR SOLN
Status: DC | PRN
  Administered 2023-02-10: 25 mL

## 2023-02-10 MED ORDER — BSS IO SOLN
INTRAOCULAR | Status: DC | PRN
  Administered 2023-02-10: 15 mL via INTRAOCULAR

## 2023-02-10 MED ORDER — POVIDONE-IODINE 5 % OP SOLN
OPHTHALMIC | Status: DC | PRN
  Administered 2023-02-10: 1 via OPHTHALMIC

## 2023-02-10 MED ORDER — LACTATED RINGERS IV SOLN: INTRAVENOUS | Status: DC

## 2023-02-10 MED ORDER — MOXIFLOXACIN HCL 5 MG/ML IO SOLN
INTRAOCULAR | Status: DC | PRN
  Administered 2023-02-10: .3 mL via INTRACAMERAL

## 2023-02-10 MED ORDER — MIDAZOLAM HCL 5 MG/5ML IJ SOLN
INTRAMUSCULAR | Status: DC | PRN
  Administered 2023-02-10: 1 mg via INTRAVENOUS

## 2023-02-10 MED ORDER — PHENYLEPHRINE HCL 2.5 % OP SOLN
1.0000 [drp] | OPHTHALMIC | Status: AC | PRN
  Administered 2023-02-10 (×3): 1 [drp] via OPHTHALMIC

## 2023-02-10 MED ORDER — FENTANYL CITRATE (PF) 100 MCG/2ML IJ SOLN
INTRAMUSCULAR | Status: DC | PRN
  Administered 2023-02-10: 50 ug via INTRAVENOUS

## 2023-02-10 MED ORDER — LIDOCAINE HCL 3.5 % OP GEL
1.0000 | Freq: Once | OPHTHALMIC | Status: AC
  Administered 2023-02-10: 1 via OPHTHALMIC

## 2023-02-10 MED ORDER — TETRACAINE HCL 0.5 % OP SOLN
1.0000 [drp] | OPHTHALMIC | Status: AC | PRN
  Administered 2023-02-10 (×3): 1 [drp] via OPHTHALMIC

## 2023-02-10 MED ORDER — SODIUM HYALURONATE 10 MG/ML IO SOLUTION
PREFILLED_SYRINGE | INTRAOCULAR | Status: DC | PRN
  Administered 2023-02-10: .85 mL via INTRAOCULAR

## 2023-02-10 SURGICAL SUPPLY — 13 items
CATARACT SUITE SIGHTPATH (MISCELLANEOUS) ×1
CLOTH BEACON ORANGE TIMEOUT ST (SAFETY) ×1 IMPLANT
EYE SHIELD UNIVERSAL CLEAR (GAUZE/BANDAGES/DRESSINGS) IMPLANT
FEE CATARACT SUITE SIGHTPATH (MISCELLANEOUS) ×1 IMPLANT
GLOVE BIOGEL PI IND STRL 6.5 (GLOVE) IMPLANT
GLOVE BIOGEL PI IND STRL 7.0 (GLOVE) ×2 IMPLANT
LENS IOL TECNIS EYHANCE 23.0 (Intraocular Lens) IMPLANT
NDL HYPO 18GX1.5 BLUNT FILL (NEEDLE) ×1 IMPLANT
NEEDLE HYPO 18GX1.5 BLUNT FILL (NEEDLE) ×1
PAD ARMBOARD 7.5X6 YLW CONV (MISCELLANEOUS) ×1 IMPLANT
SYR TB 1ML LL NO SAFETY (SYRINGE) ×1 IMPLANT
TAPE SURG TRANSPORE 1 IN (GAUZE/BANDAGES/DRESSINGS) IMPLANT
WATER STERILE IRR 250ML POUR (IV SOLUTION) ×1 IMPLANT

## 2023-02-10 NOTE — Anesthesia Preprocedure Evaluation (Signed)
Anesthesia Evaluation  Patient identified by MRN, date of birth, ID band Patient awake    Reviewed: Allergy & Precautions, H&P , NPO status , Patient's Chart, lab work & pertinent test results, reviewed documented beta blocker date and time   Airway Mallampati: II  TM Distance: >3 FB Neck ROM: full    Dental no notable dental hx.    Pulmonary neg pulmonary ROS   Pulmonary exam normal breath sounds clear to auscultation       Cardiovascular Exercise Tolerance: Good hypertension, negative cardio ROS  Rhythm:regular Rate:Normal     Neuro/Psych  PSYCHIATRIC DISORDERS  Depression    negative neurological ROS  negative psych ROS   GI/Hepatic negative GI ROS, Neg liver ROS,,,  Endo/Other  negative endocrine ROS    Renal/GU Renal diseasenegative Renal ROS  negative genitourinary   Musculoskeletal   Abdominal   Peds  Hematology negative hematology ROS (+)   Anesthesia Other Findings   Reproductive/Obstetrics negative OB ROS                             Anesthesia Physical Anesthesia Plan  ASA: 2  Anesthesia Plan: General   Post-op Pain Management:    Induction:   PONV Risk Score and Plan: Propofol infusion  Airway Management Planned:   Additional Equipment:   Intra-op Plan:   Post-operative Plan:   Informed Consent: I have reviewed the patients History and Physical, chart, labs and discussed the procedure including the risks, benefits and alternatives for the proposed anesthesia with the patient or authorized representative who has indicated his/her understanding and acceptance.     Dental Advisory Given  Plan Discussed with: CRNA  Anesthesia Plan Comments:        Anesthesia Quick Evaluation

## 2023-02-10 NOTE — Interval H&P Note (Signed)
History and Physical Interval Note:  02/10/2023 11:17 AM  Raven Golden  has presented today for surgery, with the diagnosis of combined forms age related cataract, left eye.  The various methods of treatment have been discussed with the patient and family. After consideration of risks, benefits and other options for treatment, the patient has consented to  Procedure(s) with comments: CATARACT EXTRACTION PHACO AND INTRAOCULAR LENS PLACEMENT (IOC) (Left) - CDE: as a surgical intervention.  The patient's history has been reviewed, patient examined, no change in status, stable for surgery.  I have reviewed the patient's chart and labs.  Questions were answered to the patient's satisfaction.     [name]

## 2023-02-10 NOTE — Op Note (Signed)
Date of procedure: 02/10/23  Pre-operative diagnosis: Visually significant age-related combined cataract, Left Eye (H25.812)  Post-operative diagnosis: Visually significant age-related combined cataract, Left Eye (H25.812)  Procedure: Removal of cataract via phacoemulsification and insertion of intra-ocular lens [employer] DIB00 +23.0D into the capsular bag of the Left Eye  Attending surgeon: Rudy Jew. Chae Oommen, MD, MA  Anesthesia: MAC, Topical Akten  Complications: None  Estimated Blood Loss: <36mL (minimal)  Specimens: None  Implants: As above  Indications:  Visually significant age-related cataract, Left Eye  Procedure:  The patient was seen and identified in the pre-operative area. The operative eye was identified and dilated.  The operative eye was marked.  Topical anesthesia was administered to the operative eye.     The patient was then to the operative suite and placed in the supine position.  A timeout was performed confirming the patient, procedure to be performed, and all other relevant information.   The patient's face was prepped and draped in the usual fashion for intra-ocular surgery.  A lid speculum was placed into the operative eye and the surgical microscope moved into place and focused.  An inferotemporal paracentesis was created using a 20 gauge paracentesis blade.  Shugarcaine was injected into the anterior chamber.  Viscoelastic was injected into the anterior chamber.  A temporal clear-corneal main wound incision was created using a 2.38mm microkeratome.  A continuous curvilinear capsulorrhexis was initiated using an irrigating cystitome and completed using capsulorrhexis forceps.  Hydrodissection and hydrodeliniation were performed.  Viscoelastic was injected into the anterior chamber.  A phacoemulsification handpiece and a chopper as a second instrument were used to remove the nucleus and epinucleus. The irrigation/aspiration handpiece was used to remove any  remaining cortical material.   The capsular bag was reinflated with viscoelastic, checked, and found to be intact.  The intraocular lens was inserted into the capsular bag.  The irrigation/aspiration handpiece was used to remove any remaining viscoelastic.  The clear corneal wound and paracentesis wounds were then hydrated and checked with Weck-Cels to be watertight. 0.69mL of Moxfloxacin was injected into the anterior chamber. The lid-speculum was removed.  The drape was removed.  The patient's face was cleaned with a wet and dry 4x4.    A clear shield was taped over the eye. The patient was taken to the post-operative care unit in good condition, having tolerated the procedure well.  Post-Op Instructions: The patient will follow up at [facility] for a same day post-operative evaluation and will receive all other orders and instructions.

## 2023-02-10 NOTE — Discharge Instructions (Addendum)
 Please discharge patient when stable, will follow up today with Dr. June Leap at the [facility] office immediately following discharge.  Leave shield in place until visit.  All paperwork with discharge instructions will be given at the office.  [facility] Address:  [address]  [place], [place] [address]

## 2023-02-10 NOTE — Transfer of Care (Signed)
Immediate Anesthesia Transfer of Care Note  Patient: Raven Golden  Procedure(s) Performed: CATARACT EXTRACTION PHACO AND INTRAOCULAR LENS PLACEMENT (IOC) (Left: Eye)  Patient Location: PACU  Anesthesia Type:MAC  Level of Consciousness: awake, alert , and oriented  Airway & Oxygen Therapy: Patient Spontanous Breathing  Post-op Assessment: Report given to RN and Post -op Vital signs reviewed and stable  Post vital signs: Reviewed and stable  Last Vitals:  Vitals Value Taken Time  BP 97/58   Temp 98   Pulse 47   Resp 16   SpO2 99     Last Pain:  Vitals:   02/10/23 1034  PainSc: 0-No pain         Complications: No notable events documented.

## 2023-02-11 NOTE — Anesthesia Postprocedure Evaluation (Signed)
Anesthesia Post Note  Patient: Raven Golden  Procedure(s) Performed: CATARACT EXTRACTION PHACO AND INTRAOCULAR LENS PLACEMENT (IOC) (Left: Eye)  Patient location during evaluation: Phase II Anesthesia Type: General Level of consciousness: awake Pain management: pain level controlled Vital Signs Assessment: post-procedure vital signs reviewed and stable Respiratory status: spontaneous breathing and respiratory function stable Cardiovascular status: blood pressure returned to baseline and stable Postop Assessment: no headache and no apparent nausea or vomiting Anesthetic complications: no Comments: Late entry   No notable events documented.   Last Vitals:  Vitals:   02/10/23 1034 02/10/23 1141  BP: (!) 100/56   Pulse: (!) 48 (!) 47  Resp: 18   Temp: 36.6 C 36.7 C  SpO2: 100%     Last Pain:  Vitals:   02/10/23 1141  TempSrc: Oral  PainSc:                  Windell Norfolk

## 2023-02-12 ENCOUNTER — Encounter (HOSPITAL_COMMUNITY): Payer: Self-pay | Admitting: Ophthalmology

## 2023-02-23 DIAGNOSIS — H25811 Combined forms of age-related cataract, right eye: Secondary | ICD-10-CM | POA: Diagnosis not present

## 2023-02-27 NOTE — H&P (Signed)
Surgical History & Physical  Patient Name: Raven Golden  DOB: 1955-11-18  Surgery: Cataract extraction with intraocular lens implant phacoemulsification; Right Eye Surgeon: [name] MD Surgery Date: 03/03/2023 Pre-Op Date: 01/16/2023  HPI: A 12 Yr. old female patient 1.  The patient is present for Cataract Evaluation . The patient complains of difficulty when seeing street signs, which began 2 years ago. Both eyes are affected. The episode is constant. The patient describes hazy symptoms affecting their eyes/vision. Distance vision is worse than near. This is negatively affecting the patient's quality of life and the patient is unable to function adequately in life with the current level of vision. HPI was performed by [name]  Medical History: Cataracts  Hypercholesterolemia, Anxiety disorder, Tremors  Review of Systems Negative Allergic/Immunologic Negative Cardiovascular Negative Constitutional Negative Ear, Nose, Mouth & Throat Negative Endocrine Negative Eyes Negative Gastrointestinal Negative Genitourinary Negative Hemotologic/Lymphatic Negative Integumentary Negative Musculoskeletal Negative Neurological Negative Psychiatry Negative Respiratory  Social Never smoked   Medication Prednisolone-moxiflox-bromfen,  primidone ,  doxepin ,  propranolol ,  escitalopram oxalate ,  baclofen ,  estradiol ,  rosuvastatin   Sx/Procedures Phaco c IOL OS,  Hysterectomy, Uterine leiomyome  Drug Allergies  NKDA  History & Physical: Heent: cataract NECK: supple without bruits LUNGS: lungs clear to auscultation CV: regular rate and rhythm Abdomen: soft and non-tender  Impression & Plan: Assessment: 1.  COMBINED FORMS AGE RELATED CATARACT; Both Eyes (H25.813) 2.  BLEPHARITIS; Right Upper Lid, Right Lower Lid, Left Upper Lid, Left Lower Lid (H01.001, H01.002,H01.004,H01.005) 3.  DERMATOCHALASIS, no surgery; Right Upper Lid, Left Upper Lid (H02.831, H02.834) 4.   Pinguecula; Both Eyes (H11.153)  Plan: 1.  Cataract accounts for the patient's decreased vision. This visual impairment is not correctable with a tolerable change in glasses or contact lenses. Cataract surgery with an implantation of a new lens should significantly improve the visual and functional status of the patient. Discussed all risks, benefits, alternatives, and potential complications. Discussed the procedures and recovery. Patient desires to have surgery. A-scan ordered and performed today for intra-ocular lens calculations. The surgery will be performed in order to improve vision for driving, reading, and for eye examinations. Recommend phacoemulsification with intra-ocular lens. Recommend Dextenza for post-operative pain and inflammation. Left Eye worse - first. Dilates poorly - shugarcaine by protocol. Malyugin Ring. Omidira. Vivity Lens.  2.  Blepharitis is present - recommend regular lid cleaning.  3.  Asymptomatic, recommend observation for now. Findings, prognosis and treatment options reviewed.  4.  Observe; Artificial tears as needed for irritation.

## 2023-03-01 ENCOUNTER — Encounter (HOSPITAL_COMMUNITY): Payer: Self-pay

## 2023-03-01 ENCOUNTER — Encounter (HOSPITAL_COMMUNITY)
Admission: RE | Admit: 2023-03-01 | Discharge: 2023-03-01 | Disposition: A | Discharge status: Home / Self Care | Payer: Medicare PPO | Source: Ambulatory Visit | Attending: Ophthalmology | Admitting: Ophthalmology

## 2023-03-03 ENCOUNTER — Encounter (HOSPITAL_COMMUNITY)
Admission: RE | Admit: 2023-03-03 | Discharge: 2023-03-03 | Disposition: A | Discharge status: Home / Self Care | Payer: Medicare PPO | Source: Ambulatory Visit | Attending: Ophthalmology | Admitting: Ophthalmology

## 2023-03-06 ENCOUNTER — Ambulatory Visit (HOSPITAL_BASED_OUTPATIENT_CLINIC_OR_DEPARTMENT_OTHER): Payer: Self-pay | Admitting: Anesthesiology

## 2023-03-06 ENCOUNTER — Ambulatory Visit (HOSPITAL_COMMUNITY): Payer: Self-pay | Admitting: Anesthesiology

## 2023-03-06 ENCOUNTER — Encounter (HOSPITAL_COMMUNITY): Payer: Self-pay | Admitting: Ophthalmology

## 2023-03-06 ENCOUNTER — Encounter (HOSPITAL_COMMUNITY)
Admission: RE | Disposition: A | Discharge status: Home / Self Care | Payer: Self-pay | Source: Home / Self Care | Attending: Ophthalmology

## 2023-03-06 ENCOUNTER — Ambulatory Visit (HOSPITAL_COMMUNITY)
Admission: RE | Admit: 2023-03-06 | Discharge: 2023-03-06 | Disposition: A | Discharge status: Home / Self Care | Payer: Medicare PPO | Attending: Ophthalmology | Admitting: Ophthalmology

## 2023-03-06 DIAGNOSIS — H25811 Combined forms of age-related cataract, right eye: Secondary | ICD-10-CM | POA: Diagnosis not present

## 2023-03-06 DIAGNOSIS — I129 Hypertensive chronic kidney disease with stage 1 through stage 4 chronic kidney disease, or unspecified chronic kidney disease: Secondary | ICD-10-CM | POA: Insufficient documentation

## 2023-03-06 DIAGNOSIS — H02834 Dermatochalasis of left upper eyelid: Secondary | ICD-10-CM | POA: Insufficient documentation

## 2023-03-06 DIAGNOSIS — N189 Chronic kidney disease, unspecified: Secondary | ICD-10-CM | POA: Diagnosis not present

## 2023-03-06 DIAGNOSIS — H5711 Ocular pain, right eye: Secondary | ICD-10-CM | POA: Insufficient documentation

## 2023-03-06 DIAGNOSIS — I10 Essential (primary) hypertension: Secondary | ICD-10-CM | POA: Diagnosis not present

## 2023-03-06 DIAGNOSIS — H0100A Unspecified blepharitis right eye, upper and lower eyelids: Secondary | ICD-10-CM | POA: Insufficient documentation

## 2023-03-06 DIAGNOSIS — H0100B Unspecified blepharitis left eye, upper and lower eyelids: Secondary | ICD-10-CM | POA: Insufficient documentation

## 2023-03-06 DIAGNOSIS — H11153 Pinguecula, bilateral: Secondary | ICD-10-CM | POA: Insufficient documentation

## 2023-03-06 DIAGNOSIS — H02831 Dermatochalasis of right upper eyelid: Secondary | ICD-10-CM | POA: Insufficient documentation

## 2023-03-06 SURGERY — CATARACT EXTRACTION PHACO AND INTRAOCULAR LENS PLACEMENT (IOC) with placement of Corticosteroid
Anesthesia: Monitor Anesthesia Care | Site: Eye | Laterality: Right

## 2023-03-06 MED ORDER — LIDOCAINE HCL (PF) 1 % IJ SOLN
INTRAOCULAR | Status: DC | PRN
  Administered 2023-03-06: 1 mL via OPHTHALMIC

## 2023-03-06 MED ORDER — DEXAMETHASONE 0.4 MG OP INST
VAGINAL_INSERT | OPHTHALMIC | Status: DC | PRN
  Administered 2023-03-06: .4 mg via OPHTHALMIC

## 2023-03-06 MED ORDER — SODIUM CHLORIDE 0.9% FLUSH: 3.0000 mL | INTRAVENOUS | Status: DC | PRN

## 2023-03-06 MED ORDER — MIDAZOLAM HCL 2 MG/2ML IJ SOLN
INTRAMUSCULAR | Status: DC | PRN
Start: 2023-03-06 — End: 2023-03-06
  Administered 2023-03-06: 1.5 mg via INTRAVENOUS

## 2023-03-06 MED ORDER — FENTANYL CITRATE (PF) 100 MCG/2ML IJ SOLN
INTRAMUSCULAR | Status: AC
  Filled 2023-03-06: qty 2

## 2023-03-06 MED ORDER — FENTANYL CITRATE (PF) 100 MCG/2ML IJ SOLN
INTRAMUSCULAR | Status: DC | PRN
  Administered 2023-03-06: 50 ug via INTRAVENOUS

## 2023-03-06 MED ORDER — SODIUM CHLORIDE 0.9% FLUSH: 3.0000 mL | Freq: Two times a day (BID) | INTRAVENOUS | Status: DC

## 2023-03-06 MED ORDER — SODIUM HYALURONATE 23MG/ML IO SOSY
PREFILLED_SYRINGE | INTRAOCULAR | Status: DC | PRN
  Administered 2023-03-06: .6 mL via INTRAOCULAR

## 2023-03-06 MED ORDER — PHENYLEPHRINE HCL 2.5 % OP SOLN
1.0000 [drp] | OPHTHALMIC | Status: AC | PRN
  Administered 2023-03-06 (×3): 1 [drp] via OPHTHALMIC

## 2023-03-06 MED ORDER — STERILE WATER FOR IRRIGATION IR SOLN
Status: DC | PRN
  Administered 2023-03-06: 1

## 2023-03-06 MED ORDER — TETRACAINE HCL 0.5 % OP SOLN
1.0000 [drp] | OPHTHALMIC | Status: AC | PRN
  Administered 2023-03-06 (×3): 1 [drp] via OPHTHALMIC
  Filled 2023-03-06: qty 2

## 2023-03-06 MED ORDER — SODIUM CHLORIDE 0.9% FLUSH
INTRAVENOUS | Status: DC | PRN
Start: 2023-03-06 — End: 2023-03-06
  Administered 2023-03-06: 8 mL via INTRAVENOUS

## 2023-03-06 MED ORDER — SODIUM HYALURONATE 10 MG/ML IO SOLUTION
PREFILLED_SYRINGE | INTRAOCULAR | Status: DC | PRN
  Administered 2023-03-06: .85 mL via INTRAOCULAR

## 2023-03-06 MED ORDER — BSS IO SOLN
INTRAOCULAR | Status: DC | PRN
  Administered 2023-03-06: 15 mL via INTRAOCULAR

## 2023-03-06 MED ORDER — POVIDONE-IODINE 5 % OP SOLN
OPHTHALMIC | Status: DC | PRN
  Administered 2023-03-06: 1 via OPHTHALMIC

## 2023-03-06 MED ORDER — CHLORHEXIDINE GLUCONATE 0.12 % MT SOLN
15.0000 mL | Freq: Once | OROMUCOSAL | Status: DC
Start: 2023-03-06 — End: 2023-03-06

## 2023-03-06 MED ORDER — ORAL CARE MOUTH RINSE: 15.0000 mL | Freq: Once | OROMUCOSAL | Status: DC

## 2023-03-06 MED ORDER — BSS IO SOLN
INTRAOCULAR | Status: DC | PRN
  Administered 2023-03-06: 500 mL

## 2023-03-06 MED ORDER — MIDAZOLAM HCL 2 MG/2ML IJ SOLN
INTRAMUSCULAR | Status: AC
  Filled 2023-03-06: qty 2

## 2023-03-06 MED ORDER — TROPICAMIDE 1 % OP SOLN
1.0000 [drp] | OPHTHALMIC | Status: AC | PRN
  Administered 2023-03-06 (×3): 1 [drp] via OPHTHALMIC

## 2023-03-06 MED ORDER — LIDOCAINE HCL 3.5 % OP GEL
1.0000 | Freq: Once | OPHTHALMIC | Status: AC
  Administered 2023-03-06: 1 via OPHTHALMIC

## 2023-03-06 MED ORDER — MOXIFLOXACIN HCL 5 MG/ML IO SOLN
INTRAOCULAR | Status: DC | PRN
  Administered 2023-03-06: .3 mL via INTRACAMERAL

## 2023-03-06 SURGICAL SUPPLY — 12 items
CATARACT SUITE SIGHTPATH (MISCELLANEOUS) ×1
CLOTH BEACON ORANGE TIMEOUT ST (SAFETY) ×1 IMPLANT
EYE SHIELD UNIVERSAL CLEAR (GAUZE/BANDAGES/DRESSINGS) IMPLANT
FEE CATARACT SUITE SIGHTPATH (MISCELLANEOUS) ×1 IMPLANT
GLOVE BIOGEL PI IND STRL 7.0 (GLOVE) ×2 IMPLANT
LENS IOL TECNIS EYHANCE 23.5 (Intraocular Lens) IMPLANT
NDL HYPO 18GX1.5 BLUNT FILL (NEEDLE) ×1 IMPLANT
NEEDLE HYPO 18GX1.5 BLUNT FILL (NEEDLE) ×1
PAD ARMBOARD 7.5X6 YLW CONV (MISCELLANEOUS) ×1 IMPLANT
SYR TB 1ML LL NO SAFETY (SYRINGE) ×1 IMPLANT
TAPE SURG TRANSPORE 1 IN (GAUZE/BANDAGES/DRESSINGS) IMPLANT
WATER STERILE IRR 250ML POUR (IV SOLUTION) ×1 IMPLANT

## 2023-03-06 NOTE — Op Note (Signed)
 Date of procedure: 03/06/23  Pre-operative diagnosis:  Visually significant combined form age-related cataract, Right Eye (H25.811)  Post-operative diagnosis:   1. Visually significant combined form age-related cataract, Right Eye (H25.811) 2. Pain and inflammation following cataract surgery Right Eye (H57.11)  Procedure:  Removal of cataract via phacoemulsification and insertion of intra-ocular lens [employer] DIB00 +23.5D into the capsular bag of the Right Eye 2. Placement of Dextenza insert, Right Eye  Attending surgeon: Rudy Jew. Shavawn Stobaugh, MD, MA  Anesthesia: MAC, Topical Akten  Complications: None  Estimated Blood Loss: <4mL (minimal)  Specimens: None  Implants: As above  Indications:  Visually significant age-related cataract, Right Eye  Procedure:  The patient was seen and identified in the pre-operative area. The operative eye was identified and dilated.  The operative eye was marked.  Topical anesthesia was administered to the operative eye.     The patient was then to the operative suite and placed in the supine position.  A timeout was performed confirming the patient, procedure to be performed, and all other relevant information.   The patient's face was prepped and draped in the usual fashion for intra-ocular surgery.  A lid speculum was placed into the operative eye and the surgical microscope moved into place and focused.  A superotemporal paracentesis was created using a 20 gauge paracentesis blade.  Shugarcaine was injected into the anterior chamber.  Viscoelastic was injected into the anterior chamber.  A temporal clear-corneal main wound incision was created using a 2.53mm microkeratome.  A continuous curvilinear capsulorrhexis was initiated using an irrigating cystitome and completed using capsulorrhexis forceps.  Hydrodissection and hydrodeliniation were performed.  Viscoelastic was injected into the anterior chamber.  A phacoemulsification handpiece and a  chopper as a second instrument were used to remove the nucleus and epinucleus. The irrigation/aspiration handpiece was used to remove any remaining cortical material.   The capsular bag was reinflated with viscoelastic, checked, and found to be intact.  The intraocular lens was inserted into the capsular bag.  The irrigation/aspiration handpiece was used to remove any remaining viscoelastic.  The clear corneal wound and paracentesis wounds were then hydrated and checked with Weck-Cels to be watertight. 0.68mL of moxifloxacin was injected into the anterior chamber.  The lid-speculum was removed. The lower punctum was dilated. A Dextenza implant was placed in the lower canaliculus without complication.  The drape was removed.  The patient's face was cleaned with a wet and dry 4x4. A clear shield was taped over the eye. The patient was taken to the post-operative care unit in good condition, having tolerated the procedure well.  Post-Op Instructions: The patient will follow up at [facility] for a same day post-operative evaluation and will receive all other orders and instructions.

## 2023-03-06 NOTE — Discharge Instructions (Addendum)
 Please discharge patient when stable, will follow up today with Dr. June Leap at the [facility] office immediately following discharge.  Leave shield in place until visit.  All paperwork with discharge instructions will be given at the office.  [facility] Address:  [address]  [place], [place] [address]

## 2023-03-06 NOTE — Anesthesia Preprocedure Evaluation (Signed)
 Anesthesia Evaluation  Patient identified by MRN, date of birth, ID band Patient awake    Reviewed: Allergy & Precautions, H&P , NPO status , Patient's Chart, lab work & pertinent test results, reviewed documented beta blocker date and time   Airway Mallampati: II  TM Distance: >3 FB Neck ROM: full    Dental no notable dental hx. (+) Dental Advisory Given, Teeth Intact   Pulmonary neg pulmonary ROS   Pulmonary exam normal breath sounds clear to auscultation       Cardiovascular Exercise Tolerance: Good hypertension, negative cardio ROS Normal cardiovascular exam Rhythm:regular Rate:Normal     Neuro/Psych  PSYCHIATRIC DISORDERS  Depression    negative neurological ROS     GI/Hepatic negative GI ROS, Neg liver ROS,,,  Endo/Other  negative endocrine ROS    Renal/GU Renal disease  negative genitourinary   Musculoskeletal   Abdominal   Peds  Hematology negative hematology ROS (+)   Anesthesia Other Findings   Reproductive/Obstetrics negative OB ROS                             Anesthesia Physical Anesthesia Plan  ASA: 2  Anesthesia Plan: MAC   Post-op Pain Management: Minimal or no pain anticipated   Induction:   PONV Risk Score and Plan: Midazolam  Airway Management Planned: Nasal Cannula and Natural Airway  Additional Equipment: None  Intra-op Plan:   Post-operative Plan:   Informed Consent: I have reviewed the patients History and Physical, chart, labs and discussed the procedure including the risks, benefits and alternatives for the proposed anesthesia with the patient or authorized representative who has indicated his/her understanding and acceptance.     Dental Advisory Given  Plan Discussed with: CRNA  Anesthesia Plan Comments:        Anesthesia Quick Evaluation

## 2023-03-06 NOTE — Interval H&P Note (Signed)
 History and Physical Interval Note:  03/06/2023 7:13 AM  Raven Golden  has presented today for surgery, with the diagnosis of combined forms age related cataract, right eye.  The various methods of treatment have been discussed with the patient and family. After consideration of risks, benefits and other options for treatment, the patient has consented to  Procedure(s): CATARACT EXTRACTION PHACO AND INTRAOCULAR LENS PLACEMENT (IOC) with placement of Corticosteroid (Right) as a surgical intervention.  The patient's history has been reviewed, patient examined, no change in status, stable for surgery.  I have reviewed the patient's chart and labs.  Questions were answered to the patient's satisfaction.     [name]

## 2023-03-06 NOTE — Transfer of Care (Addendum)
 Immediate Anesthesia Transfer of Care Note  Patient: Raven Golden  Procedure(s) Performed: CATARACT EXTRACTION PHACO AND INTRAOCULAR LENS PLACEMENT (IOC) with placement of Corticosteroid (Right: Eye)  Patient Location: Short Stay  Anesthesia Type:MAC  Level of Consciousness: awake and patient cooperative  Airway & Oxygen Therapy: Patient Spontanous Breathing  Post-op Assessment: Report given to RN and Post -op Vital signs reviewed and stable  Post vital signs: Reviewed and stable  Last Vitals:  Vitals Value Taken Time  BP 93/50 03/06/23   0753  Temp 36.7 03/06/23   0753  Pulse 51 03/06/23   0753  Resp 16 03/06/23   0753  SpO2 96% 03/06/23   0753    Last Pain:  Vitals:   03/06/23 0710  PainSc: 0-No pain         Complications: No notable events documented.

## 2023-03-06 NOTE — Addendum Note (Signed)
 Addendum  created 03/06/23 1131 by Ronelle Nigh, MD   Attestation recorded in [place], Flowsheet accepted, Intraprocedure Attestations deleted, Proofreader filed

## 2023-03-06 NOTE — Anesthesia Postprocedure Evaluation (Signed)
 Anesthesia Post Note  Patient: Raven Golden  Procedure(s) Performed: CATARACT EXTRACTION PHACO AND INTRAOCULAR LENS PLACEMENT (IOC) with placement of Corticosteroid (Right: Eye)  Patient location during evaluation: PACU Anesthesia Type: MAC Level of consciousness: awake and alert Pain management: pain level controlled Vital Signs Assessment: post-procedure vital signs reviewed and stable Respiratory status: spontaneous breathing, nonlabored ventilation, respiratory function stable and patient connected to nasal cannula oxygen Cardiovascular status: stable and blood pressure returned to baseline Postop Assessment: no apparent nausea or vomiting Anesthetic complications: no   There were no known notable events for this encounter.   Last Vitals:  Vitals:   03/06/23 0711 03/06/23 0753  BP:  (!) 93/50  Pulse: (!) 49 (!) 51  Resp: 14 16  Temp: 36.6 C 36.7 C  SpO2: 97% 96%    Last Pain:  Vitals:   03/06/23 0753  TempSrc: Oral  PainSc: 0-No pain                 Alveta Quintela L Brienna Bass

## 2023-03-10 ENCOUNTER — Telehealth: Payer: Self-pay | Admitting: Neurology

## 2023-03-10 NOTE — Telephone Encounter (Signed)
 Sent to PA team.

## 2023-03-10 NOTE — Telephone Encounter (Signed)
 Evamae called and LM with AN, to advise that the dose of botox might needs to be increased or changed completely cause her tremors are worse

## 2023-03-10 NOTE — Telephone Encounter (Signed)
 Called patient and let her know we will begin working on this for her

## 2023-04-06 ENCOUNTER — Other Ambulatory Visit (HOSPITAL_COMMUNITY): Payer: Self-pay

## 2023-04-06 ENCOUNTER — Telehealth: Payer: Self-pay | Admitting: Pharmacy Technician

## 2023-04-06 NOTE — Telephone Encounter (Signed)
 PA has been submitted, and telephone encounter has been created. Please see telephone encounter dated 3.27.25.

## 2023-04-06 NOTE — Telephone Encounter (Signed)
 Pharmacy Patient Advocate Encounter   Received notification from Patient Advice Request messages that prior authorization for DAXXIFY 100 is required/requested.   Insurance verification completed.   The patient is insured through [place] .   Per test claim: PA required; PA submitted to above mentioned insurance via CoverMyMeds Key/confirmation #/EOC [id number] Status is pending

## 2023-04-11 DIAGNOSIS — R5383 Other fatigue: Secondary | ICD-10-CM | POA: Diagnosis not present

## 2023-04-11 DIAGNOSIS — Z0000 Encounter for general adult medical examination without abnormal findings: Secondary | ICD-10-CM | POA: Diagnosis not present

## 2023-04-11 DIAGNOSIS — Z79899 Other long term (current) drug therapy: Secondary | ICD-10-CM | POA: Diagnosis not present

## 2023-04-11 DIAGNOSIS — Z1331 Encounter for screening for depression: Secondary | ICD-10-CM | POA: Diagnosis not present

## 2023-04-11 DIAGNOSIS — F419 Anxiety disorder, unspecified: Secondary | ICD-10-CM | POA: Diagnosis not present

## 2023-04-11 DIAGNOSIS — E785 Hyperlipidemia, unspecified: Secondary | ICD-10-CM | POA: Diagnosis not present

## 2023-04-11 DIAGNOSIS — Z299 Encounter for prophylactic measures, unspecified: Secondary | ICD-10-CM | POA: Diagnosis not present

## 2023-04-11 DIAGNOSIS — Z1339 Encounter for screening examination for other mental health and behavioral disorders: Secondary | ICD-10-CM | POA: Diagnosis not present

## 2023-04-11 DIAGNOSIS — Z7189 Other specified counseling: Secondary | ICD-10-CM | POA: Diagnosis not present

## 2023-04-12 NOTE — Telephone Encounter (Signed)
 Pharmacy Patient Advocate Encounter  Received notification from [facility] that Prior Authorization for DAXXIFY 100 has been DENIED.  Full denial letter will be uploaded to the media tab. See denial reason below.   PA #/Case ID/Reference #: [id number]

## 2023-04-13 NOTE — Telephone Encounter (Signed)
 Sent message to [employer] about this. Sent to [employer]

## 2023-04-17 NOTE — Telephone Encounter (Signed)
 Patient would like to continue using Xeomin until we determined how to get D[employer] approved

## 2023-04-30 ENCOUNTER — Other Ambulatory Visit: Payer: Self-pay | Admitting: Neurology

## 2023-04-30 DIAGNOSIS — G243 Spasmodic torticollis: Secondary | ICD-10-CM

## 2023-05-01 ENCOUNTER — Telehealth: Payer: Self-pay

## 2023-05-01 ENCOUNTER — Telehealth: Payer: Self-pay | Admitting: Pharmacy Technician

## 2023-05-01 NOTE — Telephone Encounter (Signed)
 Yes, patient did not have a change in insurance. PA is still valid until 01/10/2024. [employer] report was submitted again today.

## 2023-05-01 NOTE — Telephone Encounter (Signed)
 Hi Monchell this patient has decided she wants to stay with Botox . I see a PA for Botox  in her chart and wanted to make sure that it is still good

## 2023-05-01 NOTE — Telephone Encounter (Signed)
 Pharmacy Patient Advocate Encounter   Botox  One Portal verification has been Submitted Benefit Verification #:  [id number]   Primary Insurance: [employer] Dx Code: G24.3 J-code: Botox - [id number] Procedure code: 40981

## 2023-05-05 ENCOUNTER — Ambulatory Visit: Payer: Medicare PPO | Admitting: Neurology

## 2023-05-05 DIAGNOSIS — G243 Spasmodic torticollis: Secondary | ICD-10-CM

## 2023-05-05 NOTE — Procedures (Signed)
 [facility]   Procedure Note Botox   Attending: Dr. Ivette Marks Raven Golden  Preoperative Diagnosis(es): Cervical Dystonia  Results : Took a while to kick in, but then seemed to do okay.  Ended up switching back to onabotulinum toxin A because of time to kick in   Consent obtained from: The patient Benefits discussed included, but were not limited to decreased muscle tightness, increased joint range of motion, and decreased pain.  Risk discussed included, but were not limited pain and discomfort, bleeding, bruising, excessive weakness, venous thrombosis, muscle atrophy and dysphagia.  A copy of the patient medication guide was given to the patient which explains the blackbox warning.  Patients identity and treatment sites confirmed Yes.  .  Details of Procedure: Skin was cleaned with alcohol.  A 30 gauge, 25mm  needle was introduced to the target muscle, except for posterior splenius where 27 gauge, 1.5 inch needle used.   Prior to injection, the needle plunger was aspirated to make sure the needle was not within a blood vessel.  There was no blood retrieved on aspiration.    Following is a summary of the muscles injected and the amount of onabotulinum toxin A   Dilution 0.9% preservative free saline mixed with 100 u onabotulinum toxin A 10 U per 0.1cc  Injections  Location Left  Right Units Number of sites        Sternocleidomastoid 60/30  90 2  Splenius Capitus, posterior approach  100 100 1  Splenius Capitus, lateral approach  60 60 1  Semispinalis capitus 20  20   Trapezius 10 20 30 3  total        TOTAL UNITS:   300    Agent: Onobotulinum toxin type A.  3 vials of onobotulinum toxin a were used each freshly diluted with 1 mL of sterile, non-preserved saline to make 10u/cc.   Total injected (Units): 300  Total wasted (Units):0   Pt tolerated procedure well without complications.   Reinjection is anticipated in 4 months.

## 2023-05-08 ENCOUNTER — Other Ambulatory Visit (INDEPENDENT_AMBULATORY_CARE_PROVIDER_SITE_OTHER)

## 2023-05-08 DIAGNOSIS — G243 Spasmodic torticollis: Secondary | ICD-10-CM | POA: Diagnosis not present

## 2023-05-08 MED ORDER — ONABOTULINUMTOXINA 100 UNITS IJ SOLR
300.0000 [IU] | Freq: Once | INTRAMUSCULAR | Status: AC
  Administered 2023-05-08: 300 [IU] via INTRAMUSCULAR

## 2023-05-08 NOTE — Telephone Encounter (Signed)
 Pharmacy Patient Advocate Encounter  Botox  One Portal verification has been completed.  Benefit Verification #:  [id number]   Dx Code: G24.3  J-code: Botox - [id number] Does require PA Procedure code: 19147 Does Not require PA  Primary Insurance: [employer] Patient estimated coinsurance: 20% Patient's remaining deductible: 257  *Benefit info changes as patient continues to use their insurance benefits*

## 2023-06-09 ENCOUNTER — Ambulatory Visit: Payer: Medicare PPO | Admitting: Neurology

## 2023-07-25 DIAGNOSIS — Z0000 Encounter for general adult medical examination without abnormal findings: Secondary | ICD-10-CM | POA: Diagnosis not present

## 2023-07-25 DIAGNOSIS — E8940 Asymptomatic postprocedural ovarian failure: Secondary | ICD-10-CM | POA: Diagnosis not present

## 2023-07-25 DIAGNOSIS — Z6824 Body mass index (BMI) 24.0-24.9, adult: Secondary | ICD-10-CM | POA: Diagnosis not present

## 2023-07-25 DIAGNOSIS — F419 Anxiety disorder, unspecified: Secondary | ICD-10-CM | POA: Diagnosis not present

## 2023-07-25 DIAGNOSIS — Z299 Encounter for prophylactic measures, unspecified: Secondary | ICD-10-CM | POA: Diagnosis not present

## 2023-08-04 ENCOUNTER — Ambulatory Visit: Admitting: Neurology

## 2023-08-04 DIAGNOSIS — G243 Spasmodic torticollis: Secondary | ICD-10-CM | POA: Diagnosis not present

## 2023-08-04 MED ORDER — ONABOTULINUMTOXINA 100 UNITS IJ SOLR
300.0000 [IU] | Freq: Once | INTRAMUSCULAR | Status: AC
  Administered 2023-08-04: 300 [IU] via INTRAMUSCULAR

## 2023-08-04 NOTE — Procedures (Signed)
 [facility]   Procedure Note Botox   Attending: Dr. Asberry Shantoria Ellwood  Preoperative Diagnosis(es): Cervical Dystonia  Results : did better this time.  Prior hx:  Ended up switching back to onabotulinum toxin A because of time to kick in with Xeomin    Consent obtained from: The patient Benefits discussed included, but were not limited to decreased muscle tightness, increased joint range of motion, and decreased pain.  Risk discussed included, but were not limited pain and discomfort, bleeding, bruising, excessive weakness, venous thrombosis, muscle atrophy and dysphagia.  A copy of the patient medication guide was given to the patient which explains the blackbox warning.  Patients identity and treatment sites confirmed Yes.  .  Details of Procedure: Skin was cleaned with alcohol .  A 30 gauge, 25mm  needle was introduced to the target muscle, except for posterior splenius where 27 gauge, 1.5 inch needle used.   Prior to injection, the needle plunger was aspirated to make sure the needle was not within a blood vessel.  There was no blood retrieved on aspiration.    Following is a summary of the muscles injected and the amount of onabotulinum toxin A   Dilution 0.9% preservative free saline mixed with 100 u onabotulinum toxin A 10 U per 0.1cc  Injections  Location Left  Right Units Number of sites        Sternocleidomastoid 60/30  90 2  Splenius Capitus, posterior approach  100 100 1  Splenius Capitus, lateral approach  60 60 1  Semispinalis capitus 20  20   Trapezius 10 20 30 3  total        TOTAL UNITS:   300    Agent: Onobotulinum toxin type A.  3 vials of onobotulinum toxin a were used each freshly diluted with 1 mL of sterile, non-preserved saline to make 10u/cc.   Total injected (Units): 300  Total wasted (Units):0   Pt tolerated procedure well without complications.   Reinjection is anticipated in 4 months.

## 2023-08-08 ENCOUNTER — Other Ambulatory Visit: Payer: Self-pay | Admitting: Neurology

## 2023-08-08 DIAGNOSIS — G243 Spasmodic torticollis: Secondary | ICD-10-CM

## 2023-10-02 DIAGNOSIS — E2839 Other primary ovarian failure: Secondary | ICD-10-CM | POA: Diagnosis not present

## 2023-10-10 ENCOUNTER — Other Ambulatory Visit: Payer: Self-pay | Admitting: Internal Medicine

## 2023-10-10 DIAGNOSIS — Z1231 Encounter for screening mammogram for malignant neoplasm of breast: Secondary | ICD-10-CM

## 2023-10-30 ENCOUNTER — Ambulatory Visit
Admission: RE | Admit: 2023-10-30 | Discharge: 2023-10-30 | Disposition: A | Discharge status: Home / Self Care | Source: Ambulatory Visit | Attending: Internal Medicine | Admitting: Internal Medicine

## 2023-10-30 DIAGNOSIS — Z1231 Encounter for screening mammogram for malignant neoplasm of breast: Secondary | ICD-10-CM

## 2023-11-01 ENCOUNTER — Other Ambulatory Visit: Payer: Self-pay | Admitting: Neurology

## 2023-11-01 DIAGNOSIS — G243 Spasmodic torticollis: Secondary | ICD-10-CM

## 2023-11-03 ENCOUNTER — Ambulatory Visit: Admitting: Neurology

## 2023-11-03 DIAGNOSIS — G243 Spasmodic torticollis: Secondary | ICD-10-CM

## 2023-11-03 MED ORDER — ONABOTULINUMTOXINA 100 UNITS IJ SOLR
300.0000 [IU] | Freq: Once | INTRAMUSCULAR | Status: AC
  Administered 2023-11-03: 300 [IU] via INTRAMUSCULAR

## 2023-11-03 NOTE — Procedures (Signed)
 [facility]   Procedure Note Botox   Attending: Dr. Asberry Oktober Glazer  Preoperative Diagnosis(es): Cervical Dystonia  Results : Doing better with onobotulinum toxin A  Current toxin: Onobotulinum toxin A Prior toxin: Incobotulinumtoxin A (not as effective)  Consent obtained from: The patient Benefits discussed included, but were not limited to decreased muscle tightness, increased joint range of motion, and decreased pain.  Risk discussed included, but were not limited pain and discomfort, bleeding, bruising, excessive weakness, venous thrombosis, muscle atrophy and dysphagia.  A copy of the patient medication guide was given to the patient which explains the blackbox warning.  Patients identity and treatment sites confirmed Yes.  .  Details of Procedure: Skin was cleaned with alcohol .  A 30 gauge, 25mm  needle was introduced to the target muscle, except for posterior splenius where 27 gauge, 1.5 inch needle used.   Prior to injection, the needle plunger was aspirated to make sure the needle was not within a blood vessel.  There was no blood retrieved on aspiration.    Following is a summary of the muscles injected and the amount of onabotulinum toxin A   Dilution 0.9% preservative free saline mixed with 100 u onabotulinum toxin A 10 U per 0.1cc  Injections  Location Left  Right Units Number of sites        Sternocleidomastoid 60/30  90 2  Splenius Capitus, posterior approach  100 100 1  Splenius Capitus, lateral approach  60 60 1  Semispinalis capitus 20  20   Trapezius 10 20 30 3  total        TOTAL UNITS:   300    Agent: Onobotulinum toxin type A.  3 vials of onobotulinum toxin a were used each freshly diluted with 1 mL of sterile, non-preserved saline to make 10u/cc.   Total injected (Units): 300  Total wasted (Units):0   Pt tolerated procedure well without complications.   Reinjection is anticipated in 4 months.

## 2023-11-10 ENCOUNTER — Telehealth: Payer: Self-pay | Admitting: Pharmacy Technician

## 2023-11-10 NOTE — Telephone Encounter (Signed)
 Pharmacy Patient Advocate Encounter  Received notification from [employer] that Prior Authorization for BOTOX  has been APPROVED from 1.1.26 to 12.31.26   PA #/Case ID/Reference #: [id number]

## 2024-02-02 ENCOUNTER — Ambulatory Visit: Admitting: Neurology

## 2024-02-02 DIAGNOSIS — G243 Spasmodic torticollis: Secondary | ICD-10-CM

## 2024-02-02 MED ORDER — ONABOTULINUMTOXINA 100 UNITS IJ SOLR
300.0000 [IU] | Freq: Once | INTRAMUSCULAR | Status: AC
  Administered 2024-02-02: 300 [IU] via INTRAMUSCULAR

## 2024-02-02 NOTE — Procedures (Signed)
 [facility]   Procedure Note Botox   Attending: Dr. Asberry Ej Pinson  Preoperative Diagnosis(es): Cervical Dystonia  Results : Doing better with onobotulinum toxin A but still wears off 2 weeks prior to when toxin is due for injection  Current toxin: Onobotulinum toxin A Prior toxin: Incobotulinumtoxin A (not as effective)  Consent obtained from: The patient Benefits discussed included, but were not limited to decreased muscle tightness, increased joint range of motion, and decreased pain.  Risk discussed included, but were not limited pain and discomfort, bleeding, bruising, excessive weakness, venous thrombosis, muscle atrophy and dysphagia.  A copy of the patient medication guide was given to the patient which explains the blackbox warning.  Patients identity and treatment sites confirmed Yes.  .  Details of Procedure: Skin was cleaned with alcohol .  A 30 gauge, 25mm  needle was introduced to the target muscle, except for posterior splenius where 27 gauge, 1.5 inch needle used.   Prior to injection, the needle plunger was aspirated to make sure the needle was not within a blood vessel.  There was no blood retrieved on aspiration.    Following is a summary of the muscles injected and the amount of onabotulinum toxin A   Dilution 0.9% preservative free saline mixed with 100 u onabotulinum toxin A 10 U per 0.1cc  Injections  Location Left  Right Units Number of sites        Sternocleidomastoid 60/30  90 2  Splenius Capitus, posterior approach  100 100 1  Splenius Capitus, lateral approach  60 60 1  Semispinalis capitus 20  20   Trapezius 10 20 30 3  total        TOTAL UNITS:   300    Agent: Onobotulinum toxin type A.  3 vials of onobotulinum toxin a were used each freshly diluted with 1 mL of sterile, non-preserved saline to make 10u/cc.   Total injected (Units): 300  Total wasted (Units):0   Pt tolerated procedure well without complications.   Reinjection is anticipated in  4 months.

## 2024-05-03 ENCOUNTER — Ambulatory Visit: Payer: Self-pay | Admitting: Neurology
